# Patient Record
Sex: Male | Born: 1935 | Race: White | Hispanic: No
Health system: Southern US, Community
[De-identification: ages and names within clinical notes are randomized; demographics above are authoritative.]

## PROBLEM LIST (undated history)

## (undated) DIAGNOSIS — F0390 Unspecified dementia without behavioral disturbance: Secondary | ICD-10-CM

---

## 2019-10-16 ENCOUNTER — Other Ambulatory Visit: Payer: Self-pay

## 2019-10-16 ENCOUNTER — Inpatient Hospital Stay
Admission: EM | Admit: 2019-10-16 | Discharge: 2019-10-26 | DRG: 640 | Disposition: A | Discharge status: Hospice | Payer: Medicare Other | Attending: Family Medicine | Admitting: Family Medicine

## 2019-10-16 ENCOUNTER — Encounter: Payer: Self-pay | Admitting: Internal Medicine

## 2019-10-16 ENCOUNTER — Emergency Department: Payer: Medicare Other

## 2019-10-16 DIAGNOSIS — F0281 Dementia in other diseases classified elsewhere with behavioral disturbance: Secondary | ICD-10-CM | POA: Diagnosis not present

## 2019-10-16 DIAGNOSIS — R4182 Altered mental status, unspecified: Secondary | ICD-10-CM

## 2019-10-16 DIAGNOSIS — E871 Hypo-osmolality and hyponatremia: Secondary | ICD-10-CM | POA: Diagnosis present

## 2019-10-16 DIAGNOSIS — E870 Hyperosmolality and hypernatremia: Principal | ICD-10-CM | POA: Diagnosis present

## 2019-10-16 DIAGNOSIS — I10 Essential (primary) hypertension: Secondary | ICD-10-CM | POA: Diagnosis present

## 2019-10-16 DIAGNOSIS — E876 Hypokalemia: Secondary | ICD-10-CM | POA: Diagnosis present

## 2019-10-16 DIAGNOSIS — E872 Acidosis, unspecified: Secondary | ICD-10-CM | POA: Diagnosis present

## 2019-10-16 DIAGNOSIS — R627 Adult failure to thrive: Secondary | ICD-10-CM | POA: Diagnosis present

## 2019-10-16 DIAGNOSIS — Z7982 Long term (current) use of aspirin: Secondary | ICD-10-CM | POA: Diagnosis not present

## 2019-10-16 DIAGNOSIS — J189 Pneumonia, unspecified organism: Secondary | ICD-10-CM

## 2019-10-16 DIAGNOSIS — R1310 Dysphagia, unspecified: Secondary | ICD-10-CM | POA: Diagnosis present

## 2019-10-16 DIAGNOSIS — R778 Other specified abnormalities of plasma proteins: Secondary | ICD-10-CM | POA: Diagnosis present

## 2019-10-16 DIAGNOSIS — B948 Sequelae of other specified infectious and parasitic diseases: Secondary | ICD-10-CM | POA: Diagnosis not present

## 2019-10-16 DIAGNOSIS — Z515 Encounter for palliative care: Secondary | ICD-10-CM | POA: Diagnosis present

## 2019-10-16 DIAGNOSIS — Z66 Do not resuscitate: Secondary | ICD-10-CM

## 2019-10-16 DIAGNOSIS — N179 Acute kidney failure, unspecified: Secondary | ICD-10-CM | POA: Diagnosis present

## 2019-10-16 DIAGNOSIS — E860 Dehydration: Secondary | ICD-10-CM | POA: Diagnosis present

## 2019-10-16 DIAGNOSIS — F0391 Unspecified dementia with behavioral disturbance: Secondary | ICD-10-CM | POA: Diagnosis present

## 2019-10-16 DIAGNOSIS — F03918 Unspecified dementia, unspecified severity, with other behavioral disturbance: Secondary | ICD-10-CM | POA: Diagnosis present

## 2019-10-16 DIAGNOSIS — G309 Alzheimer's disease, unspecified: Secondary | ICD-10-CM | POA: Diagnosis not present

## 2019-10-16 DIAGNOSIS — G9341 Metabolic encephalopathy: Secondary | ICD-10-CM | POA: Diagnosis present

## 2019-10-16 HISTORY — DX: Unspecified dementia, unspecified severity, without behavioral disturbance, psychotic disturbance, mood disturbance, and anxiety: F03.90

## 2019-10-16 LAB — COMPREHENSIVE METABOLIC PANEL
ALT: 36 U/L (ref 0–44)
AST: 57 U/L — ABNORMAL HIGH (ref 15–41)
Albumin: 4.5 g/dL (ref 3.5–5.0)
Alkaline Phosphatase: 70 U/L (ref 38–126)
Anion gap: 27 — ABNORMAL HIGH (ref 5–15)
BUN: 122 mg/dL — ABNORMAL HIGH (ref 8–23)
CO2: 16 mmol/L — ABNORMAL LOW (ref 22–32)
Calcium: 9.7 mg/dL (ref 8.9–10.3)
Chloride: 121 mmol/L — ABNORMAL HIGH (ref 98–111)
Creatinine, Ser: 4.41 mg/dL — ABNORMAL HIGH (ref 0.61–1.24)
GFR calc Af Amer: 13 mL/min — ABNORMAL LOW (ref >60)
GFR calc non Af Amer: 11 mL/min — ABNORMAL LOW (ref >60)
Glucose, Bld: 162 mg/dL — ABNORMAL HIGH (ref 70–99)
Potassium: 4 mmol/L (ref 3.5–5.1)
Sodium: 164 mmol/L (ref 135–145)
Total Bilirubin: 1.5 mg/dL — ABNORMAL HIGH (ref 0.3–1.2)
Total Protein: 9.2 g/dL — ABNORMAL HIGH (ref 6.5–8.1)

## 2019-10-16 LAB — BASIC METABOLIC PANEL
Anion gap: 18 — ABNORMAL HIGH (ref 5–15)
BUN: 122 mg/dL — ABNORMAL HIGH (ref 8–23)
CO2: 19 mmol/L — ABNORMAL LOW (ref 22–32)
Calcium: 8.6 mg/dL — ABNORMAL LOW (ref 8.9–10.3)
Chloride: 121 mmol/L — ABNORMAL HIGH (ref 98–111)
Creatinine, Ser: 3.79 mg/dL — ABNORMAL HIGH (ref 0.61–1.24)
GFR calc Af Amer: 16 mL/min — ABNORMAL LOW (ref >60)
GFR calc non Af Amer: 13 mL/min — ABNORMAL LOW (ref >60)
Glucose, Bld: 242 mg/dL — ABNORMAL HIGH (ref 70–99)
Potassium: 3.8 mmol/L (ref 3.5–5.1)
Sodium: 158 mmol/L — ABNORMAL HIGH (ref 135–145)

## 2019-10-16 LAB — CBC WITH DIFFERENTIAL/PLATELET
Abs Immature Granulocytes: 0.08 10*3/uL — ABNORMAL HIGH (ref 0.00–0.07)
Basophils Absolute: 0.1 10*3/uL (ref 0.0–0.1)
Basophils Relative: 0 %
Eosinophils Absolute: 0 10*3/uL (ref 0.0–0.5)
Eosinophils Relative: 0 %
HCT: 60.2 % — ABNORMAL HIGH (ref 39.0–52.0)
Hemoglobin: 19.3 g/dL — ABNORMAL HIGH (ref 13.0–17.0)
Immature Granulocytes: 1 %
Lymphocytes Relative: 19 %
Lymphs Abs: 3.2 10*3/uL (ref 0.7–4.0)
MCH: 29.6 pg (ref 26.0–34.0)
MCHC: 32.1 g/dL (ref 30.0–36.0)
MCV: 92.3 fL (ref 80.0–100.0)
Monocytes Absolute: 0.9 10*3/uL (ref 0.1–1.0)
Monocytes Relative: 5 %
Neutro Abs: 12.6 10*3/uL — ABNORMAL HIGH (ref 1.7–7.7)
Neutrophils Relative %: 75 %
Platelets: 306 10*3/uL (ref 150–400)
RBC: 6.52 MIL/uL — ABNORMAL HIGH (ref 4.22–5.81)
RDW: 13.8 % (ref 11.5–15.5)
WBC: 16.9 10*3/uL — ABNORMAL HIGH (ref 4.0–10.5)
nRBC: 0.2 % (ref 0.0–0.2)

## 2019-10-16 LAB — URINALYSIS, COMPLETE (UACMP) WITH MICROSCOPIC
Bilirubin Urine: NEGATIVE
Glucose, UA: NEGATIVE mg/dL
Hgb urine dipstick: NEGATIVE
Ketones, ur: NEGATIVE mg/dL
Leukocytes,Ua: NEGATIVE
Nitrite: NEGATIVE
Protein, ur: 30 mg/dL — AB
Specific Gravity, Urine: 1.021 (ref 1.005–1.030)
pH: 5 (ref 5.0–8.0)

## 2019-10-16 LAB — LACTIC ACID, PLASMA
Lactic Acid, Venous: 7.3 mmol/L (ref 0.5–1.9)
Lactic Acid, Venous: 4.3 mmol/L (ref 0.5–1.9)
Lactic Acid, Venous: 4.3 mmol/L (ref 0.5–1.9)

## 2019-10-16 LAB — CREATININE, URINE, RANDOM: Creatinine, Urine: 392 mg/dL

## 2019-10-16 LAB — TROPONIN I (HIGH SENSITIVITY)
Troponin I (High Sensitivity): 174 ng/L (ref <18)
Troponin I (High Sensitivity): 153 ng/L (ref <18)
Troponin I (High Sensitivity): 134 ng/L (ref <18)

## 2019-10-16 LAB — SODIUM, URINE, RANDOM: Sodium, Ur: <10 mmol/L

## 2019-10-16 LAB — MRSA PCR SCREENING: MRSA by PCR: NEGATIVE

## 2019-10-16 MED ORDER — DEXTROSE 5 % IV SOLN: Freq: Once | INTRAVENOUS | Status: AC

## 2019-10-16 MED ORDER — ONDANSETRON HCL 4 MG PO TABS: 4.0000 mg | ORAL_TABLET | Freq: Four times a day (QID) | ORAL | Status: DC | PRN

## 2019-10-16 MED ORDER — ACETAMINOPHEN 650 MG RE SUPP: 650.0000 mg | Freq: Four times a day (QID) | RECTAL | Status: DC | PRN

## 2019-10-16 MED ORDER — SENNOSIDES-DOCUSATE SODIUM 8.6-50 MG PO TABS: 1.0000 | ORAL_TABLET | Freq: Every evening | ORAL | Status: DC | PRN

## 2019-10-16 MED ORDER — ACETAMINOPHEN 325 MG PO TABS: 650.0000 mg | ORAL_TABLET | Freq: Four times a day (QID) | ORAL | Status: DC | PRN

## 2019-10-16 MED ORDER — SODIUM CHLORIDE 0.9 % IV SOLN
1.0000 g | INTRAVENOUS | Status: DC
  Administered 2019-10-16: 1 g via INTRAVENOUS
  Filled 2019-10-16 (×2): qty 10

## 2019-10-16 MED ORDER — DEXTROSE 5 % IV SOLN: Freq: Once | INTRAVENOUS | Status: DC

## 2019-10-16 MED ORDER — SODIUM CHLORIDE 0.9 % IV SOLN
500.0000 mg | INTRAVENOUS | Status: DC
  Administered 2019-10-16: 18:00:00 500 mg via INTRAVENOUS
  Filled 2019-10-16 (×2): qty 500

## 2019-10-16 MED ORDER — HEPARIN SODIUM (PORCINE) 5000 UNIT/ML IJ SOLN
5000.0000 [IU] | Freq: Three times a day (TID) | INTRAMUSCULAR | Status: DC
  Administered 2019-10-16: 5000 [IU] via SUBCUTANEOUS
  Filled 2019-10-16: qty 1

## 2019-10-16 MED ORDER — LACTATED RINGERS IV BOLUS
1000.0000 mL | Freq: Once | INTRAVENOUS | Status: AC
  Administered 2019-10-16: 1000 mL via INTRAVENOUS

## 2019-10-16 MED ORDER — DEXTROSE 5 % IV SOLN: INTRAVENOUS | Status: DC

## 2019-10-16 MED ORDER — HEPARIN SODIUM (PORCINE) 5000 UNIT/ML IJ SOLN
5000.0000 [IU] | Freq: Three times a day (TID) | INTRAMUSCULAR | Status: DC
  Administered 2019-10-17 – 2019-10-21 (×14): 5000 [IU] via SUBCUTANEOUS
  Filled 2019-10-16 (×15): qty 1

## 2019-10-16 MED ORDER — ONDANSETRON HCL 4 MG/2ML IJ SOLN: 4.0000 mg | Freq: Four times a day (QID) | INTRAMUSCULAR | Status: DC | PRN

## 2019-10-16 NOTE — ED Notes (Addendum)
Second iv placed, unable to obtain blood cultures, pt has limited access and difficult to get blood r/t hydration status. Ok for just one set blood cx per dr Jimmye Norman

## 2019-10-16 NOTE — ED Notes (Signed)
Patient transported to CT 

## 2019-10-16 NOTE — H&P (Signed)
History and Physical:    Marc Perez   VWU:[id number] DOB: 08/21/1930 DOA: 10/16/2019  Referring MD/provider: Dr. Daryel NovemberJonathan Perez PCP: Patient, No Pcp Per   Patient coming from:   Chief Complaint: Increasing confusion/altered mental status  History of Present Illness:   Marc Perez is an 85 y.o. male with medical history significant for hypertension and dementia who was brought from the nursing home because of increasing confusion and altered mental status.  Patient is confused and unable to provide any history.  I spoke to his healthcare power of attorney, Mr. Marc Perez, who said patient has dementia at baseline but is able to communicate to some extent.  He also said that the patient has no family and he is the sole Education officer, environmentaldecision-maker.  Patient is a DO NOT RESUSCITATE.  ED Course:  The patient was found to have severe hypernatremia and acute renal failure.  He also had leukocytosis and lactic acidosis.  Chest x-ray showed right lower lobe consolidation.  He was given IV fluids in the emergency room.  ROS:   ROS unable to obtain because patient has dementia and is confused Past Medical History:   Past Medical History:  Diagnosis Date  . Dementia [facility](HCC)     Past Surgical History:   No known surgeries  Social History:   Social History   Socioeconomic History  . Marital status: Widowed    Spouse name: Not on file  . Number of children: Not on file  . Years of education: Not on file  . Highest education level: Not on file  Occupational History  . Not on file  Tobacco Use  . Smoking status: Not on file  Substance and Sexual Activity  . Alcohol use: Not on file  . Drug use: Not on file  . Sexual activity: Not on file  Other Topics Concern  . Not on file  Social History Narrative  . Not on file   Social Determinants of Health   Financial Resource Strain:   . Difficulty of Paying Living Expenses: Not on file  Food Insecurity:   . Worried About Patent examinerunning Out  of Food in the Last Year: Not on file  . Ran Out of Food in the Last Year: Not on file  Transportation Needs:   . Lack of Transportation (Medical): Not on file  . Lack of Transportation (Non-Medical): Not on file  Physical Activity:   . Days of Exercise per Week: Not on file  . Minutes of Exercise per Session: Not on file  Stress:   . Feeling of Stress : Not on file  Social Connections:   . Frequency of Communication with Friends and Family: Not on file  . Frequency of Social Gatherings with Friends and Family: Not on file  . Attends Religious Services: Not on file  . Active Member of Clubs or Organizations: Not on file  . Attends BankerClub or Organization Meetings: Not on file  . Marital Status: Not on file  Intimate Partner Violence:   . Fear of Current or Ex-Partner: Not on file  . Emotionally Abused: Not on file  . Physically Abused: Not on file  . Sexually Abused: Not on file    Allergies   Patient has no known allergies.  Family history:   No family history on file.  Current Medications:   Prior to Admission medications   Not on File    Physical Exam:   Vitals:   10/16/19 1239 10/16/19 1251 10/16/19 1317 10/16/19 1430  BP:  134/90 (!) 152/72  Pulse:      Resp:   (!) 27 20  Temp:      TempSrc:      SpO2:  100% 100% 100%  Weight: 74.8 kg     Height: 5\' 10"  (1.778 m)        Physical Exam: Blood pressure (!) 152/72, pulse 100, temperature 97.7 F (36.5 C), temperature source Rectal, resp. rate 20, height 5\' 10"  (1.778 m), weight 74.8 kg, SpO2 100 %. Gen: No acute distress. Head: Normocephalic, atraumatic. Eyes: Pupils equal, round and reactive to light. Extraocular movements intact.  Sclerae nonicteric.  Mouth: Dry mucous membranes Neck: Supple, no thyromegaly, no lymphadenopathy, no jugular venous distention. Chest: Lungs are clear to auscultation with good air movement. No rales, rhonchi or wheezes.  CV: Heart sounds are regular with an S1, S2. No  murmurs, rubs, clicks, or gallops.  Abdomen: Soft, nontender, nondistended with normal active bowel sounds. No hepatosplenomegaly or palpable masses. Extremities: Extremities are without clubbing, or cyanosis. No edema. Pedal pulses 2+.  Skin: Warm and dry.  Bruises on knees on elbows Neuro: Alert but disoriented, confused Psych: Poor insight and judgment   Data Review:    Labs: Basic Metabolic Panel: Recent Labs  Lab 10/16/19 1224  NA 164*  K 4.0  CL 121*  CO2 16*  GLUCOSE 162*  BUN 122*  CREATININE 4.41*  CALCIUM 9.7   Liver Function Tests: Recent Labs  Lab 10/16/19 1224  AST 57*  ALT 36  ALKPHOS 70  BILITOT 1.5*  PROT 9.2*  ALBUMIN 4.5   No results for input(s): LIPASE, AMYLASE in the last 168 hours. No results for input(s): AMMONIA in the last 168 hours. CBC: Recent Labs  Lab 10/16/19 1224  WBC 16.9*  NEUTROABS 12.6*  HGB 19.3*  HCT 60.2*  MCV 92.3  PLT 306   Cardiac Enzymes: No results for input(s): CKTOTAL, CKMB, CKMBINDEX, TROPONINI in the last 168 hours.  BNP (last 3 results) No results for input(s): PROBNP in the last 8760 hours. CBG: No results for input(s): GLUCAP in the last 168 hours.  Urinalysis    Component Value Date/Time   COLORURINE AMBER (A) 10/16/2019 1228   APPEARANCEUR CLOUDY (A) 10/16/2019 1228   LABSPEC 1.021 10/16/2019 1228   PHURINE 5.0 10/16/2019 1228   GLUCOSEU NEGATIVE 10/16/2019 1228   HGBUR NEGATIVE 10/16/2019 1228   BILIRUBINUR NEGATIVE 10/16/2019 [place] 10/16/2019 1228   PROTEINUR 30 (A) 10/16/2019 1228   NITRITE NEGATIVE 10/16/2019 1228   LEUKOCYTESUR NEGATIVE 10/16/2019 1228      Radiographic Studies: DG Chest 1 View  Result Date: 10/16/2019 CLINICAL DATA:  Weakness EXAM: CHEST  1 VIEW COMPARISON:  None. FINDINGS: The heart size and mediastinal contours are within normal limits. Minimal patchy density at the right lung base. The visualized skeletal structures are unremarkable.  IMPRESSION: Minimal patchy atelectasis/consolidation at the right lung base. Electronically Signed   By: Macy Mis M.D.   On: 10/16/2019 13:22   CT Head Wo Contrast  Result Date: 10/16/2019 CLINICAL DATA:  Progressive altered mental status. History of dementia. COVID-19. EXAM: CT HEAD WITHOUT CONTRAST TECHNIQUE: Contiguous axial images were obtained from the base of the skull through the vertex without intravenous contrast. COMPARISON:  None. FINDINGS: Brain: No evidence of acute infarction, hemorrhage, hydrocephalus, extra-axial collection or mass lesion/mass effect. Diffuse moderate atrophy with secondary ventricular dilatation. Chronic periventricular white matter lucency particularly in the frontal lobes, likely small vessel ischemic disease. Vascular: No  hyperdense vessel or unexpected calcification. Skull: Normal. Negative for fracture or focal lesion. Sinuses/Orbits: Normal. Other: None IMPRESSION: No acute abnormality. Atrophy with chronic small vessel ischemic disease. Electronically Signed   By: Francene Boyers M.D.   On: 10/16/2019 13:00    EKG: Independently reviewed.  Normal sinus rhythm, right bundle branch block and left anterior fascicular block   Assessment/Plan:   Principal Problem:   Hypernatremia Active Problems:   Acute renal failure (ARF) (HCC)   Lactic acidosis   Body mass index is 23.68 kg/m.   Severe hypernatremia: Admit to MedSurg and monitor on telemetry.  Treat with IV fluids.  Repeat BMP today to follow-up sodium level.  Severe acute renal failure: Treat with IV fluids and monitor BMP.  Chart review shows that creatinine was 1.2 on November 02, 2017 at [facility].  Mildly elevated troponin: This is likely due to acute renal failure.  Repeat troponin.  Right lung base consolidation: Unknown whether this is pneumonia or not but will cover with empiric IV antibiotics given lactic acidosis and leukocytosis.  Leukocytosis/bandemia: Probably due to severe  dehydration/acute renal failure.  Severe lactic acidosis: This is likely due to acute renal failure.  Repeat lactic acid level.  Other information:   DVT prophylaxis: Heparin Code Status: DNR. Family Communication: Plan discussed with his healthcare power of attorney, Mr. Marc Sorrow Perez Disposition Plan: Possible discharge to nursing home in 3 to 4 days Consults called: None Admission status: Inpatient  The medical decision making on this patient was of high complexity and the patient is at high risk for clinical deterioration, therefore this is a level 3 visit.   Time spent 65 minutes  Promise Weldin [facility]   How to contact the [facility] Attending or Consulting provider 7A - 7P or covering provider during after hours 7P -7A, for this patient?   1. Check the care team in [facility] and look for a) attending/consulting [facility] provider listed and b) the [facility] team listed 2. Log into [email or web address] and use [facility]'s universal password to access. If you do not have the password, please contact the hospital operator. 3. Locate the [facility] provider you are looking for under [facility] and page to a number that you can be directly reached. 4. If you still have difficulty reaching the provider, please page the [facility] (Director on Call) for the Hospitalists listed on amion for assistance.  10/16/2019, 2:51 PM

## 2019-10-16 NOTE — ED Provider Notes (Signed)
[facility] Emergency Department Provider Note       Time seen: ----------------------------------------- 12:31 PM on 10/16/2019 ----------------------------------------- Level V caveat: History/ROS limited by altered mental status  I have reviewed the triage vital signs and the nursing notes.  HISTORY   Chief Complaint Altered Mental Status   HPI Marc Perez is a 85 y.o. male with a history of dementia who presents to the ED for altered mental status that has been increasing since Friday.  Patient does have a history of dementia, no other information is known at this time.  No past medical history on file.  There are no problems to display for this patient.  Allergies Patient has no allergy information on record.  Social History Social History   Tobacco Use  . Smoking status: Not on file  Substance Use Topics  . Alcohol use: Not on file  . Drug use: Not on file    Review of Systems Unknown, positive for altered mental status All systems negative/normal/unremarkable except as stated in the HPI  ____________________________________________   PHYSICAL EXAM:  VITAL SIGNS: ED Triage Vitals  Enc Vitals Group     BP      Pulse      Resp      Temp      Temp src      SpO2      Weight      Height      Head Circumference      Peak Flow      Pain Score      Pain Loc      Pain Edu?      Excl. in GC?     Constitutional: Alert but disoriented, chronically ill-appearing, no acute distress ENT      Head: Normocephalic and atraumatic.      Nose: No congestion/rhinnorhea.      Mouth/Throat: Mucous membranes are moist.      Neck: No stridor. Cardiovascular: Normal rate, regular rhythm. No murmurs, rubs, or gallops. Respiratory: Normal respiratory effort without tachypnea nor retractions. Breath sounds are clear and equal bilaterally. No wheezes/rales/rhonchi. Gastrointestinal: Soft and nontender. Normal bowel sounds Musculoskeletal:  Limited range of motion of the extremities Neurologic:  No gross focal neurologic deficits are appreciated.  Generalized weakness, nothing focal Skin:  Skin is warm, dry and intact. No rash noted. Psychiatric: Flat affect ____________________________________________  EKG: Interpreted by me.  Sinus rhythm with rate of 98 bpm, right bundle branch block, left anterior fascicular block, long QT  ____________________________________________  ED COURSE:  As part of my medical decision making, I reviewed the following data within the electronic MEDICAL RECORD NUMBER History obtained from family if available, nursing notes, old chart and ekg, as well as notes from prior ED visits. Patient presented for weakness and altered mental status, we will assess with labs and imaging as indicated at this time.   Procedures  Marc Perez was evaluated in Emergency Department on 10/16/2019 for the symptoms described in the history of present illness. He was evaluated in the context of the global COVID-19 pandemic, which necessitated consideration that the patient might be at risk for infection with the SARS-CoV-2 virus that causes COVID-19. Institutional protocols and algorithms that pertain to the evaluation of patients at risk for COVID-19 are in a state of rapid change based on information released by regulatory bodies including the CDC and federal and state organizations. These policies and algorithms were followed during the patient's care in the ED.  ____________________________________________  LABS (pertinent positives/negatives)  Labs Reviewed  CBC WITH DIFFERENTIAL/PLATELET - Abnormal; Notable for the following components:      Result Value   WBC 16.9 (*)    RBC 6.52 (*)    Hemoglobin 19.3 (*)    HCT 60.2 (*)    Neutro Abs 12.6 (*)    Abs Immature Granulocytes 0.08 (*)    All other components within normal limits  COMPREHENSIVE METABOLIC PANEL - Abnormal; Notable for the following components:    Sodium 164 (*)    Chloride 121 (*)    CO2 16 (*)    Glucose, Bld 162 (*)    BUN 122 (*)    Creatinine, Ser 4.41 (*)    Total Protein 9.2 (*)    AST 57 (*)    Total Bilirubin 1.5 (*)    GFR calc non Af Amer 11 (*)    GFR calc Af Amer 13 (*)    Anion gap 27 (*)    All other components within normal limits  URINALYSIS, COMPLETE (UACMP) WITH MICROSCOPIC - Abnormal; Notable for the following components:   Color, Urine AMBER (*)    APPearance CLOUDY (*)    Protein, ur 30 (*)    Bacteria, UA RARE (*)    All other components within normal limits  CULTURE, BLOOD (ROUTINE X 2)  CULTURE, BLOOD (ROUTINE X 2)  LACTIC ACID, PLASMA  TROPONIN I (HIGH SENSITIVITY)   CRITICAL CARE Performed by: Laurence Aly   Total critical care time: 30 minutes  Critical care time was exclusive of separately billable procedures and treating other patients.  Critical care was necessary to treat or prevent imminent or life-threatening deterioration.  Critical care was time spent personally by me on the following activities: development of treatment plan with patient and/or surrogate as well as nursing, discussions with consultants, evaluation of patient's response to treatment, examination of patient, obtaining history from patient or surrogate, ordering and performing treatments and interventions, ordering and review of laboratory studies, ordering and review of radiographic studies, pulse oximetry and re-evaluation of patient's condition.  RADIOLOGY Images were viewed by me  CT head, chest x-ray IMPRESSION:  No acute abnormality. Atrophy with chronic small vessel ischemic  disease.  IMPRESSION:  Minimal patchy atelectasis/consolidation at the right lung base.  ____________________________________________   DIFFERENTIAL DIAGNOSIS   Dehydration, electrolyte abnormality, occult infection, sepsis, COVID-19, CVA, MI  FINAL ASSESSMENT AND PLAN  Altered mental status, severe dehydration, possible  pneumonia   Plan: The patient had presented for altered mental status. Patient's labs revealed severe dehydration with acute renal failure and a sodium of 164.  Creatinine was 4.41.  Initially on arrival I suspected this, gave him a lactated Ringer's bolus followed by ordering D5W at 100 cc/h.  He has a 5.1 L free water deficit on arrival. Patient's imaging reveals a possible atelectasis or consolidation in the right lung base.  I have ordered IV Levaquin for him.  I will discuss with the hospitalist for admission.   Laurence Aly, MD    Note: This note was generated in part or whole with voice recognition software. Voice recognition is usually quite accurate but there are transcription errors that can and very often do occur. I apologize for any typographical errors that were not detected and corrected.     Earleen Newport, MD 10/16/19 1329

## 2019-10-16 NOTE — ED Notes (Signed)
Transported to floor with SWAT RN

## 2019-10-16 NOTE — ED Triage Notes (Signed)
Pt arrives from [facility] via [facility]. Staff stated pt has had increasing altered mental status since last Friday. Pt has hx of dementia.

## 2019-10-17 ENCOUNTER — Inpatient Hospital Stay: Payer: Medicare Other

## 2019-10-17 ENCOUNTER — Encounter: Payer: Self-pay | Admitting: Internal Medicine

## 2019-10-17 LAB — BASIC METABOLIC PANEL
Anion gap: 15 (ref 5–15)
Anion gap: 15 (ref 5–15)
Anion gap: 12 (ref 5–15)
BUN: 113 mg/dL — ABNORMAL HIGH (ref 8–23)
BUN: 109 mg/dL — ABNORMAL HIGH (ref 8–23)
BUN: 91 mg/dL — ABNORMAL HIGH (ref 8–23)
CO2: 20 mmol/L — ABNORMAL LOW (ref 22–32)
CO2: 20 mmol/L — ABNORMAL LOW (ref 22–32)
CO2: 22 mmol/L (ref 22–32)
Calcium: 8.6 mg/dL — ABNORMAL LOW (ref 8.9–10.3)
Calcium: 8.5 mg/dL — ABNORMAL LOW (ref 8.9–10.3)
Calcium: 8.4 mg/dL — ABNORMAL LOW (ref 8.9–10.3)
Chloride: 122 mmol/L — ABNORMAL HIGH (ref 98–111)
Chloride: 123 mmol/L — ABNORMAL HIGH (ref 98–111)
Chloride: 120 mmol/L — ABNORMAL HIGH (ref 98–111)
Creatinine, Ser: 3.21 mg/dL — ABNORMAL HIGH (ref 0.61–1.24)
Creatinine, Ser: 2.57 mg/dL — ABNORMAL HIGH (ref 0.61–1.24)
Creatinine, Ser: 2.02 mg/dL — ABNORMAL HIGH (ref 0.61–1.24)
GFR calc Af Amer: 19 mL/min — ABNORMAL LOW (ref >60)
GFR calc Af Amer: 25 mL/min — ABNORMAL LOW (ref >60)
GFR calc Af Amer: 33 mL/min — ABNORMAL LOW (ref >60)
GFR calc non Af Amer: 16 mL/min — ABNORMAL LOW (ref >60)
GFR calc non Af Amer: 22 mL/min — ABNORMAL LOW (ref >60)
GFR calc non Af Amer: 29 mL/min — ABNORMAL LOW (ref >60)
Glucose, Bld: 122 mg/dL — ABNORMAL HIGH (ref 70–99)
Glucose, Bld: 156 mg/dL — ABNORMAL HIGH (ref 70–99)
Glucose, Bld: 140 mg/dL — ABNORMAL HIGH (ref 70–99)
Potassium: 3.6 mmol/L (ref 3.5–5.1)
Potassium: 3.5 mmol/L (ref 3.5–5.1)
Potassium: 3.4 mmol/L — ABNORMAL LOW (ref 3.5–5.1)
Sodium: 157 mmol/L — ABNORMAL HIGH (ref 135–145)
Sodium: 158 mmol/L — ABNORMAL HIGH (ref 135–145)
Sodium: 154 mmol/L — ABNORMAL HIGH (ref 135–145)

## 2019-10-17 LAB — CBC
HCT: 49.2 % (ref 39.0–52.0)
Hemoglobin: 15.9 g/dL (ref 13.0–17.0)
MCH: 29.2 pg (ref 26.0–34.0)
MCHC: 32.3 g/dL (ref 30.0–36.0)
MCV: 90.4 fL (ref 80.0–100.0)
Platelets: 194 10*3/uL (ref 150–400)
RBC: 5.44 MIL/uL (ref 4.22–5.81)
RDW: 13.5 % (ref 11.5–15.5)
WBC: 15.3 10*3/uL — ABNORMAL HIGH (ref 4.0–10.5)
nRBC: 0 % (ref 0.0–0.2)

## 2019-10-17 LAB — LACTIC ACID, PLASMA
Lactic Acid, Venous: 3.5 mmol/L (ref 0.5–1.9)
Lactic Acid, Venous: 2.4 mmol/L (ref 0.5–1.9)

## 2019-10-17 LAB — MAGNESIUM: Magnesium: 3.5 mg/dL — ABNORMAL HIGH (ref 1.7–2.4)

## 2019-10-17 LAB — SARS CORONAVIRUS 2 (TAT 6-24 HRS): SARS Coronavirus 2: POSITIVE — AB

## 2019-10-17 MED ORDER — POTASSIUM CHLORIDE 10 MEQ/100ML IV SOLN
10.0000 meq | INTRAVENOUS | Status: AC
  Administered 2019-10-17 (×3): 10 meq via INTRAVENOUS
  Filled 2019-10-17 (×3): qty 100

## 2019-10-17 MED ORDER — ORAL CARE MOUTH RINSE
15.0000 mL | Freq: Two times a day (BID) | OROMUCOSAL | Status: DC
  Administered 2019-10-17 – 2019-10-26 (×18): 15 mL via OROMUCOSAL

## 2019-10-17 MED ORDER — LORAZEPAM 2 MG/ML IJ SOLN
1.0000 mg | Freq: Once | INTRAMUSCULAR | Status: AC
  Administered 2019-10-17: 1 mg via INTRAVENOUS
  Filled 2019-10-17: qty 1

## 2019-10-17 MED ORDER — SODIUM CHLORIDE 0.9% FLUSH
3.0000 mL | Freq: Two times a day (BID) | INTRAVENOUS | Status: DC
  Administered 2019-10-17 – 2019-10-24 (×13): 3 mL via INTRAVENOUS

## 2019-10-17 NOTE — Progress Notes (Signed)
In to see pt. Pt only oriented to self. Pt with feet up in the air rocking back and forth trying to get out of bed. Pt hollering and trying to pull mitts off. Notified NP. Orders placed. Will continue to monitor and assess.

## 2019-10-17 NOTE — Progress Notes (Addendum)
Progress Note    Marc Perez  GEZ:[id number] DOB: 02-01-1930  DOA: 10/16/2019 PCP: Patient, No Pcp Per        Assessment/Plan:   Principal Problem:   Hypernatremia Active Problems:   Acute renal failure (ARF) (HCC)   Lactic acidosis   Body mass index is 23.68 kg/m.   Severe hypernatremia: Slowly improving.  Continue IV fluids.  Repeat BMP today and adjust IV fluids accordingly.  Severe acute renal failure: Slowly improving.  Urine sodium is less than 10 and FENa is less than 1% suggestive of prerenal etiology.  Continue IV fluids and monitor BMP.  His creatinine was 1.2 on November 02, 2017 at [facility].  Mildly elevated troponin: This is likely due to acute renal failure.    Repeat chest x-ray today did not show any right lung base consolidation that was seen on chest x-ray done on 10/16/2019.  Discontinue IV antibiotics.  Leukocytosis/bandemia: Likely to severe dehydration/acute renal failure.  Severe lactic acidosis: This is likely due to acute renal failure.  Improved.   Dysphagia: Speech therapist recommended dysphagia 1 diet and nectar thick liquids  History of coronavirus infection a month ago: This was confirmed by [name], HPOA. Apparently (according to ED nurse Dellis Filbert), nursing home could not fax record of his previous positive test so patient had a repeat coronavirus test on 10/16/2019 which came back positive.   Family Communication/Anticipated D/C date and plan/Code Status   DVT prophylaxis: Heparin subcu Code Status: DNR Family Communication: Plan of care was discussed with [name], [place]. Marland Kitchen Disposition Plan: Possible discharge to SNF in 2 to 3 days     Subjective:   He is confused and unable to provide any history.  Objective:    Vitals:   10/16/19 2032 10/17/19 0453 10/17/19 0643 10/17/19 0810  BP: 138/74 126/61 (!) 150/87 (!) 148/73  Pulse: 74 68 73 74  Resp: 20 16 20 20   Temp: (!) 97.4 F (36.3 C)  97.9 F (36.6 C) 97.6 F (36.4 C)   TempSrc: Oral Oral Oral   SpO2: 98% 99% 100% 99%  Weight:      Height:        Intake/Output Summary (Last 24 hours) at 10/17/2019 1420 Last data filed at 10/17/2019 1300 Gross per 24 hour  Intake 1019.21 ml  Output --  Net 1019.21 ml   Filed Weights   10/16/19 1239  Weight: 74.8 kg    Exam:  GEN: NAD SKIN: No rash EYES: EOMI ENT: MMM CV: RRR PULM: CTA B ABD: soft, ND, NT, +BS CNS: Alert but confused, non focal EXT: No edema or tenderness   Data Reviewed:   I have personally reviewed following labs and imaging studies:  Labs: Labs show the following:   Basic Metabolic Panel: Recent Labs  Lab 10/16/19 1224 10/16/19 1741 10/16/19 2328 10/17/19 0544  NA 164* 158* 157* 158*  K 4.0 3.8 3.6 3.5  CL 121* 121* 122* 123*  CO2 16* 19* 20* 20*  GLUCOSE 162* 242* 122* 156*  BUN 122* 122* 113* 109*  CREATININE 4.41* 3.79* 3.21* 2.57*  CALCIUM 9.7 8.6* 8.6* 8.5*  MG  --   --  3.5*  --    GFR Estimated Creatinine Clearance: 20.9 mL/min (A) (by C-G formula based on SCr of 2.57 mg/dL (H)). Liver Function Tests: Recent Labs  Lab 10/16/19 1224  AST 57*  ALT 36  ALKPHOS 70  BILITOT 1.5*  PROT 9.2*  ALBUMIN 4.5  No results for input(s): LIPASE, AMYLASE in the last 168 hours. No results for input(s): AMMONIA in the last 168 hours. Coagulation profile No results for input(s): INR, PROTIME in the last 168 hours.  CBC: Recent Labs  Lab 10/16/19 1224 10/17/19 0544  WBC 16.9* 15.3*  NEUTROABS 12.6*  --   HGB 19.3* 15.9  HCT 60.2* 49.2  MCV 92.3 90.4  PLT 306 194   Cardiac Enzymes: No results for input(s): CKTOTAL, CKMB, CKMBINDEX, TROPONINI in the last 168 hours. BNP (last 3 results) No results for input(s): PROBNP in the last 8760 hours. CBG: No results for input(s): GLUCAP in the last 168 hours. D-Dimer: No results for input(s): DDIMER in the last 72 hours. Hgb A1c: No results for input(s): HGBA1C in the last  72 hours. Lipid Profile: No results for input(s): CHOL, HDL, LDLCALC, TRIG, CHOLHDL, LDLDIRECT in the last 72 hours. Thyroid function studies: No results for input(s): TSH, T4TOTAL, T3FREE, THYROIDAB in the last 72 hours.  Invalid input(s): FREET3 Anemia work up: No results for input(s): VITAMINB12, FOLATE, FERRITIN, TIBC, IRON, RETICCTPCT in the last 72 hours. Sepsis Labs: Recent Labs  Lab 10/16/19 1224 10/16/19 1611 10/16/19 1741 10/16/19 2328 10/17/19 0544 10/17/19 0714  WBC 16.9*  --   --   --  15.3*  --   LATICACIDVEN 7.3* 4.3* 4.3* 3.5*  --  2.4*    Microbiology Recent Results (from the past 240 hour(s))  Blood culture (routine x 2)     Status: None (Preliminary result)   Collection Time: 10/16/19 12:25 PM   Specimen: BLOOD  Result Value Ref Range Status   Specimen Description BLOOD RIGHT ANTECUBITAL  Final   Special Requests   Final    BOTTLES DRAWN AEROBIC AND ANAEROBIC Blood Culture adequate volume   Culture   Final    NO GROWTH < 24 HOURS Performed at [facility], [address] [place], [place] [address]    Report Status PENDING  Incomplete  SARS CORONAVIRUS 2 (TAT 6-24 HRS) Nasopharyngeal Nasopharyngeal Swab     Status: Abnormal   Collection Time: 10/16/19  4:43 PM   Specimen: Nasopharyngeal Swab  Result Value Ref Range Status   SARS Coronavirus 2 POSITIVE (A) NEGATIVE Final    Comment: RESULT CALLED TO, READ BACK BY AND VERIFIED WITH: J.PAGE,RN [id number] 10/17/19 G.MCADOO (NOTE) SARS-CoV-2 target nucleic acids are DETECTED. The SARS-CoV-2 RNA is generally detectable in upper and lower respiratory specimens during the acute phase of infection. Positive results are indicative of the presence of SARS-CoV-2 RNA. Clinical correlation with patient history and other diagnostic information is  necessary to determine patient infection status. Positive results do not rule out bacterial infection or co-infection with other viruses.  The expected result is  Negative. Fact Sheet for Patients: HairSlick.no Fact Sheet for Healthcare Providers: quierodirigir.com This test is not yet approved or cleared by the [place] FDA and  has been authorized for detection and/or diagnosis of SARS-CoV-2 by FDA under an Emergency Use Authorization (EUA). This EUA will remain  in effect (meaning this test can be used) for the du ration of the COVID-19 declaration under Section 564(b)(1) of the Act, 21 U.S.C. section 360bbb-3(b)(1), unless the authorization is terminated or revoked sooner. Performed at [facility], [address] [place], [place] [address]   Blood culture (routine x 2)     Status: None (Preliminary result)   Collection Time: 10/16/19  5:41 PM   Specimen: BLOOD  Result Value Ref Range Status  Specimen Description BLOOD BLOOD RIGHT HAND  Final   Special Requests   Final    BOTTLES DRAWN AEROBIC AND ANAEROBIC Blood Culture results may not be optimal due to an inadequate volume of blood received in culture bottles   Culture   Final    NO GROWTH < 24 HOURS Performed at [facility], [address] [place], [place] [address]    Report Status PENDING  Incomplete  MRSA PCR Screening     Status: None   Collection Time: 10/16/19  8:36 PM   Specimen: Nasopharyngeal  Result Value Ref Range Status   MRSA by PCR NEGATIVE NEGATIVE Final    Comment:        The GeneXpert MRSA Assay (FDA approved for NASAL specimens only), is one component of a comprehensive MRSA colonization surveillance program. It is not intended to diagnose MRSA infection nor to guide or monitor treatment for MRSA infections. Performed at [facility], [address] [place], [place] [address]     Procedures and diagnostic studies:  DG Chest 1 View  Result Date: 10/16/2019 CLINICAL DATA:  Weakness EXAM: CHEST  1 VIEW COMPARISON:  None. FINDINGS: The heart size and mediastinal  contours are within normal limits. Minimal patchy density at the right lung base. The visualized skeletal structures are unremarkable. IMPRESSION: Minimal patchy atelectasis/consolidation at the right lung base. Electronically Signed   By: Guadlupe SpanishPraneil  Patel M.D.   On: 10/16/2019 13:22   CT Head Wo Contrast  Result Date: 10/16/2019 CLINICAL DATA:  Progressive altered mental status. History of dementia. COVID-19. EXAM: CT HEAD WITHOUT CONTRAST TECHNIQUE: Contiguous axial images were obtained from the base of the skull through the vertex without intravenous contrast. COMPARISON:  None. FINDINGS: Brain: No evidence of acute infarction, hemorrhage, hydrocephalus, extra-axial collection or mass lesion/mass effect. Diffuse moderate atrophy with secondary ventricular dilatation. Chronic periventricular white matter lucency particularly in the frontal lobes, likely small vessel ischemic disease. Vascular: No hyperdense vessel or unexpected calcification. Skull: Normal. Negative for fracture or focal lesion. Sinuses/Orbits: Normal. Other: None IMPRESSION: No acute abnormality. Atrophy with chronic small vessel ischemic disease. Electronically Signed   By: Francene BoyersJames  Maxwell M.D.   On: 10/16/2019 13:00   DG Chest Port 1 View  Result Date: 10/17/2019 CLINICAL DATA:  COVID positive.  Pneumonia. EXAM: PORTABLE CHEST 1 VIEW COMPARISON:  10/16/2019 FINDINGS: Heart and mediastinal contours are within normal limits. No focal opacities or effusions. No acute bony abnormality. IMPRESSION: No active disease. Electronically Signed   By: Charlett NoseKevin  Dover M.D.   On: 10/17/2019 10:09    Medications:   . heparin  5,000 Units Subcutaneous Q8H  . mouth rinse  15 mL Mouth Rinse BID   Continuous Infusions: . dextrose 100 mL/hr at 10/17/19 0643     LOS: 1 day   Santosha Jividen  [facility]   *Please refer to [email or web address], password [email or web address] to get updated schedule on who will round on this patient, as hospitalists switch teams  weekly. If 7PM-7AM, please contact night-coverage at [email or web address], password [email or web address] for any overnight needs.  10/17/2019, 2:20 PM

## 2019-10-17 NOTE — Plan of Care (Signed)
  Problem: Urinary Elimination: Goal: Signs and symptoms of infection will decrease Outcome: Progressing   Problem: Safety: Goal: Ability to remain free from injury will improve Outcome: Progressing   

## 2019-10-17 NOTE — Progress Notes (Signed)
Patient being transferred to 2A, POA Sonia Side Ridenhour) notified.

## 2019-10-17 NOTE — Evaluation (Signed)
Clinical/Bedside Swallow Evaluation Patient Details  Name: Marc Perez MRN: [id number] Date of Birth: 06/17/1930  Today's Date: 10/17/2019 Time: SLP Start Time (ACUTE ONLY): 9629 SLP Stop Time (ACUTE ONLY): 0940 SLP Time Calculation (min) (ACUTE ONLY): 65 min  Past Medical History:  Past Medical History:  Diagnosis Date  . Dementia [facility])    Past Surgical History: History reviewed. No pertinent surgical history. HPI:  Pt is an 85 y.o. male with medical history significant for hypertension and Dementia who was brought from the nursing home because of increasing confusion and altered mental status.  Patient is confused and unable to provide any history.  I spoke to his healthcare power of attorney, Marc Perez, who said patient has dementia at baseline but is able to communicate to some extent.  He also said that the patient has no family and he is the sole Marine scientist.  Pt resides at [facility] and requires assistance w/ ADLs.  The patient was found to have severe hypernatremia and acute renal failure.  He also had leukocytosis and lactic acidosis.  Chest x-ray showed yesterday "Minimal patchy atelectasis/consolidation at the right lung base";  CXR today revealed "No active disease".  Pt is Covid Positive per Lab test and is on Airborne precautions.    Assessment / Plan / Recommendation Clinical Impression  Pt appears to present w/ oropharyngeal phase dysphagia; pt is at increased risk for aspiration at this time in light of declined Cognitive status(Dementia baseline). Pt is also missing Dentition and had a TERRIBLE oral cavity presentation w/ Mod-Severe dried/wet secretions and phlegm covering the roof of the mouth, cheeks, and tongue. Pt required min-mod verbal/tactile cues to follow along w/ SLP in order that oral care be done. Pt's oral cavity was cleaned; debris removed. Pt was accepting of po trials presented by SLP but suspect he reluctance may have been impacted by the TERRIBLE  status of his oral cavity initially; Cogitive decline/awareness. Pt consumed few po trials accepted w/ subtle s/s of aspiration noted: delayed throat clearing w/ trials of ice chips and thin liquids. No s/s of aspiration were noted w/ trials of Nectar liquids and purees -- no decline in respiratory status, no delayed throat clearing/coughing. Pt did not verbalize w/ SLP; few phonations noted. Bolus management of po trials revealed min decreased awareness of boluses for timely, cohesive management. W/ trials of puree and Nectar liquids, pt appeared to exhibit the best lingual control of bolus material; pharyngeal swallows were appreciated and oral clearing achieved. Pt appeared to [facility] when he swallowed and nodded when asked if throat was "sore". OM exam revealed no gross unilateral weakness. Pt required feeding support, positioning upright for po's.  Recommend a Dysphagial evel 1 w/ Nectar liquids w/ aspiration precautions; feeding support at meals and monitoring for s/s of aspiration. Pills Crushed in Puree. ST services will continue to monitor status while admitted. MD/NSG updated.  SLP Visit Diagnosis: Dysphagia, oropharyngeal phase (R13.12)(declined Cognitive status, Dementia)    Aspiration Risk  Mild-Mod aspiration risk;Risk for inadequate nutrition/hydration    Diet Recommendation  Dysphagia level 1 (puree w/ gravies) w/ Nectar consistency liquids; aspiration precautions; feeding support and oral care by staff   Medication Administration: Crushed with puree(for safer swallowing)    Other  Recommendations Recommended Consults: (Dietician f/u; Palliative Care consult for [place]) Oral Care Recommendations: Oral care BID;Oral care before and after PO;Staff/trained caregiver to provide oral care Other Recommendations: Order thickener from pharmacy;Prohibited food (jello, ice cream, thin soups);Remove water pitcher;Have oral suction available  Follow up Recommendations Skilled Nursing facility       Frequency and Duration min 3x week  2 weeks       Prognosis Prognosis for Safe Diet Advancement: Fair Barriers to Reach Goals: Cognitive deficits;Time post onset;Severity of deficits      Swallow Study   General Date of Onset: 10/16/19 HPI: Pt is an 85 y.o. male with medical history significant for hypertension and Dementia who was brought from the nursing home because of increasing confusion and altered mental status.  Patient is confused and unable to provide any history.  I spoke to his healthcare power of attorney, Marc Perez, who said patient has dementia at baseline but is able to communicate to some extent.  He also said that the patient has no family and he is the sole Education officer, environmental.  Pt resides at [facility] and requires assistance w/ ADLs.  The patient was found to have severe hypernatremia and acute renal failure.  He also had leukocytosis and lactic acidosis.  Chest x-ray showed yesterday "Minimal patchy atelectasis/consolidation at the right lung base";  CXR today revealed "No active disease".  Pt is Covid Positive per Lab test and is on Airborne precautions.  Type of Study: Bedside Swallow Evaluation Previous Swallow Assessment: none per chart Diet Prior to this Study: NPO(unknown baseline) Temperature Spikes Noted: No(wbc 15.3) Respiratory Status: Room air History of Recent Intubation: No Behavior/Cognition: Alert;Cooperative;Pleasant mood;Confused;Distractible;Requires cueing Oral Cavity Assessment: Dried secretions;Excessive secretions(chunks of debris) Oral Care Completed by SLP: Yes Oral Cavity - Dentition: Poor condition;Missing dentition Vision: (n/a) Self-Feeding Abilities: Total assist Patient Positioning: Upright in bed(needed positioning) Baseline Vocal Quality: Low vocal intensity(mumbled speech) Volitional Cough: Cognitively unable to elicit Volitional Swallow: Unable to elicit    Oral/Motor/Sensory Function Overall Oral Motor/Sensory Function:  Within functional limits(grossly - no unilateral weakness)   Ice Chips Ice chips: Impaired Presentation: Spoon(fed; 2 trials) Oral Phase Impairments: Poor awareness of bolus;Reduced lingual movement/coordination Oral Phase Functional Implications: Prolonged oral transit Pharyngeal Phase Impairments: (throat clearing x1) Other Comments: seemed to wince when swallowing   Thin Liquid Thin Liquid: Impaired Presentation: Spoon(fed; 3 trials) Oral Phase Impairments: Reduced labial seal;Reduced lingual movement/coordination;Poor awareness of bolus Oral Phase Functional Implications: Prolonged oral transit(spillage) Pharyngeal  Phase Impairments: Throat Clearing - Delayed    Nectar Thick Nectar Thick Liquid: Within functional limits(grossly) Presentation: Spoon;Straw(4 trials via spoon; 2 trials via straw) Other Comments: he would not accept more. Seemed to wince when swallowing.   Honey Thick Honey Thick Liquid: Not tested   Puree Puree: Within functional limits(grossly) Presentation: Spoon(fed; 4 trials) Other Comments: seemed to wince when swallowing. Would not accept more.    Solid     Solid: Not tested Other Comments: missing Dentition; Cognitive status       Jerilynn Som, MS, CCC-SLP Chriss Mannan 10/17/2019,11:54 AM

## 2019-10-17 NOTE — Plan of Care (Signed)
  Problem: Education: Goal: Knowledge of General Education information will improve Description Including pain rating scale, medication(s)/side effects and non-pharmacologic comfort measures Outcome: Progressing   Problem: Elimination: Goal: Will not experience complications related to urinary retention Outcome: Progressing   Problem: Safety: Goal: Ability to remain free from injury will improve Outcome: Progressing   

## 2019-10-18 DIAGNOSIS — F0391 Unspecified dementia with behavioral disturbance: Secondary | ICD-10-CM | POA: Diagnosis present

## 2019-10-18 DIAGNOSIS — G309 Alzheimer's disease, unspecified: Secondary | ICD-10-CM

## 2019-10-18 DIAGNOSIS — F0281 Dementia in other diseases classified elsewhere with behavioral disturbance: Secondary | ICD-10-CM

## 2019-10-18 DIAGNOSIS — F03918 Unspecified dementia, unspecified severity, with other behavioral disturbance: Secondary | ICD-10-CM | POA: Diagnosis present

## 2019-10-18 LAB — BASIC METABOLIC PANEL
Anion gap: 11 (ref 5–15)
BUN: 66 mg/dL — ABNORMAL HIGH (ref 8–23)
CO2: 24 mmol/L (ref 22–32)
Calcium: 8.5 mg/dL — ABNORMAL LOW (ref 8.9–10.3)
Chloride: 116 mmol/L — ABNORMAL HIGH (ref 98–111)
Creatinine, Ser: 1.41 mg/dL — ABNORMAL HIGH (ref 0.61–1.24)
GFR calc Af Amer: 52 mL/min — ABNORMAL LOW (ref >60)
GFR calc non Af Amer: 44 mL/min — ABNORMAL LOW (ref >60)
Glucose, Bld: 119 mg/dL — ABNORMAL HIGH (ref 70–99)
Potassium: 3.5 mmol/L (ref 3.5–5.1)
Sodium: 151 mmol/L — ABNORMAL HIGH (ref 135–145)

## 2019-10-18 LAB — CBC
HCT: 46.8 % (ref 39.0–52.0)
Hemoglobin: 15.3 g/dL (ref 13.0–17.0)
MCH: 29.2 pg (ref 26.0–34.0)
MCHC: 32.7 g/dL (ref 30.0–36.0)
MCV: 89.3 fL (ref 80.0–100.0)
Platelets: 165 10*3/uL (ref 150–400)
RBC: 5.24 MIL/uL (ref 4.22–5.81)
RDW: 13.2 % (ref 11.5–15.5)
WBC: 9.1 10*3/uL (ref 4.0–10.5)
nRBC: 0 % (ref 0.0–0.2)

## 2019-10-18 LAB — MAGNESIUM: Magnesium: 3.1 mg/dL — ABNORMAL HIGH (ref 1.7–2.4)

## 2019-10-18 LAB — GLUCOSE, CAPILLARY: Glucose-Capillary: 100 mg/dL — ABNORMAL HIGH (ref 70–99)

## 2019-10-18 MED ORDER — POTASSIUM CL IN DEXTROSE 5% 20 MEQ/L IV SOLN
20.0000 meq | INTRAVENOUS | Status: DC
  Administered 2019-10-18 – 2019-10-20 (×4): 20 meq via INTRAVENOUS
  Filled 2019-10-18 (×6): qty 1000

## 2019-10-18 NOTE — Progress Notes (Addendum)
Progress Note    Marc Perez  DJM:[id number] DOB: [birth year]  DOA: 10/16/2019 PCP: Patient, No Pcp Per        Assessment/Plan:   Principal Problem:   Hypernatremia Active Problems:   Acute renal failure (ARF) (HCC)   Lactic acidosis   Dementia with behavioral disturbance (HCC)   Body mass index is 23.68 kg/m.   Severe hypernatremia: Slowly improving.  Continue IV fluids.  Repeat BMP tomorrow  Severe acute renal failure: Slowly improving.  Urine sodium is less than 10 and FENa is less than 1% suggestive of prerenal etiology.  Continue IV fluids and monitor BMP.  His creatinine was 1.2 on November 02, 2017 at [facility] healthcare.  Mildly elevated troponin: This is likely due to acute renal failure.    Hypokalemia: Improved.  Continue repletion.  Hypermagnesemia: This is likely due to renal failure.  It is expected to improve as renal failure improves.  Repeat chest x-ray on 10/17/2019 did not show any right lung base consolidation that was seen on chest x-ray done on 10/16/2019.   Leukocytosis/bandemia: Resolved  Severe lactic acidosis: This is likely due to acute renal failure.  Improved.   Dysphagia: Speech therapist recommended dysphagia 1 diet and nectar thick liquids  History of coronavirus infection a month ago: This was confirmed by [name], HPOA. Apparently (according to ED nurse Tinnie Gens), nursing home could not fax record of his previous positive test so patient had a repeat coronavirus test on 10/16/2019 which came back positive.   Family Communication/Anticipated D/C date and plan/Code Status   DVT prophylaxis: Heparin subcu Code Status: DNR Family Communication: None. Disposition Plan: Possible discharge to SNF in 2 days     Subjective:   He is confused and unable to provide any history.  Objective:    Vitals:   10/17/19 2045 10/18/19 0441 10/18/19 0447 10/18/19 0759  BP: 123/71  132/86 126/78  Pulse: 80  (!) 56 67  Resp: 20   20 18   Temp: 98.4 F (36.9 C)  97.7 F (36.5 C) 98.4 F (36.9 C)  TempSrc: Oral  Oral Oral  SpO2: 98%  100% 99%  Weight:  74.8 kg    Height:        Intake/Output Summary (Last 24 hours) at 10/18/2019 1402 Last data filed at 10/18/2019 0439 Gross per 24 hour  Intake 142.62 ml  Output 500 ml  Net -357.38 ml   Filed Weights   10/16/19 1239 10/18/19 0441  Weight: 74.8 kg 74.8 kg    Exam:  GEN: NAD SKIN: No rash EYES: No pallor or icterus ENT: MMM CV: RRR PULM: CTA B ABD: soft, ND, NT, +BS CNS: AAO x 1 (person), non focal EXT: No edema or tenderness    Data Reviewed:   I have personally reviewed following labs and imaging studies:  Labs: Labs show the following:   Basic Metabolic Panel: Recent Labs  Lab 10/16/19 1741 10/16/19 2328 10/17/19 0544 10/17/19 1555 10/18/19 0609  NA 158* 157* 158* 154* 151*  K 3.8 3.6 3.5 3.4* 3.5  CL 121* 122* 123* 120* 116*  CO2 19* 20* 20* 22 24  GLUCOSE 242* 122* 156* 140* 119*  BUN 122* 113* 109* 91* 66*  CREATININE 3.79* 3.21* 2.57* 2.02* 1.41*  CALCIUM 8.6* 8.6* 8.5* 8.4* 8.5*  MG  --  3.5*  --   --  3.1*   GFR Estimated Creatinine Clearance: 38.1 mL/min (A) (by C-G formula based on SCr of 1.41 mg/dL (H)).  Liver Function Tests: Recent Labs  Lab 10/16/19 1224  AST 57*  ALT 36  ALKPHOS 70  BILITOT 1.5*  PROT 9.2*  ALBUMIN 4.5   No results for input(s): LIPASE, AMYLASE in the last 168 hours. No results for input(s): AMMONIA in the last 168 hours. Coagulation profile No results for input(s): INR, PROTIME in the last 168 hours.  CBC: Recent Labs  Lab 10/16/19 1224 10/17/19 0544 10/18/19 0609  WBC 16.9* 15.3* 9.1  NEUTROABS 12.6*  --   --   HGB 19.3* 15.9 15.3  HCT 60.2* 49.2 46.8  MCV 92.3 90.4 89.3  PLT 306 194 165   Cardiac Enzymes: No results for input(s): CKTOTAL, CKMB, CKMBINDEX, TROPONINI in the last 168 hours. BNP (last 3 results) No results for input(s): PROBNP in the last 8760  hours. CBG: No results for input(s): GLUCAP in the last 168 hours. D-Dimer: No results for input(s): DDIMER in the last 72 hours. Hgb A1c: No results for input(s): HGBA1C in the last 72 hours. Lipid Profile: No results for input(s): CHOL, HDL, LDLCALC, TRIG, CHOLHDL, LDLDIRECT in the last 72 hours. Thyroid function studies: No results for input(s): TSH, T4TOTAL, T3FREE, THYROIDAB in the last 72 hours.  Invalid input(s): FREET3 Anemia work up: No results for input(s): VITAMINB12, FOLATE, FERRITIN, TIBC, IRON, RETICCTPCT in the last 72 hours. Sepsis Labs: Recent Labs  Lab 10/16/19 1224 10/16/19 1611 10/16/19 1741 10/16/19 2328 10/17/19 0544 10/17/19 0714 10/18/19 0609  WBC 16.9*  --   --   --  15.3*  --  9.1  LATICACIDVEN 7.3* 4.3* 4.3* 3.5*  --  2.4*  --     Microbiology Recent Results (from the past 240 hour(s))  Blood culture (routine x 2)     Status: None (Preliminary result)   Collection Time: 10/16/19 12:25 PM   Specimen: BLOOD  Result Value Ref Range Status   Specimen Description BLOOD RIGHT ANTECUBITAL  Final   Special Requests   Final    BOTTLES DRAWN AEROBIC AND ANAEROBIC Blood Culture adequate volume   Culture   Final    NO GROWTH 2 DAYS Performed at [facility], [address] [place], [place] [address]    Report Status PENDING  Incomplete  SARS CORONAVIRUS 2 (TAT 6-24 HRS) Nasopharyngeal Nasopharyngeal Swab     Status: Abnormal   Collection Time: 10/16/19  4:43 PM   Specimen: Nasopharyngeal Swab  Result Value Ref Range Status   SARS Coronavirus 2 POSITIVE (A) NEGATIVE Final    Comment: RESULT CALLED TO, READ BACK BY AND VERIFIED WITH: J.PAGE,RN [id number] 10/17/19 G.MCADOO (NOTE) SARS-CoV-2 target nucleic acids are DETECTED. The SARS-CoV-2 RNA is generally detectable in upper and lower respiratory specimens during the acute phase of infection. Positive results are indicative of the presence of SARS-CoV-2 RNA. Clinical correlation with patient  history and other diagnostic information is  necessary to determine patient infection status. Positive results do not rule out bacterial infection or co-infection with other viruses.  The expected result is Negative. Fact Sheet for Patients: HairSlick.nohttps://www.fda.gov/media/138098/download Fact Sheet for Healthcare Providers: quierodirigir.comhttps://www.fda.gov/media/138095/download This test is not yet approved or cleared by the [place] FDA and  has been authorized for detection and/or diagnosis of SARS-CoV-2 by FDA under an Emergency Use Authorization (EUA). This EUA will remain  in effect (meaning this test can be used) for the du ration of the COVID-19 declaration under Section 564(b)(1) of the Act, 21 U.S.C. section 360bbb-3(b)(1), unless the authorization is terminated or revoked sooner. Performed at  [facility], [place] [address], [place], [place] [address]   Blood culture (routine x 2)     Status: None (Preliminary result)   Collection Time: 10/16/19  5:41 PM   Specimen: BLOOD  Result Value Ref Range Status   Specimen Description BLOOD BLOOD RIGHT HAND  Final   Special Requests   Final    BOTTLES DRAWN AEROBIC AND ANAEROBIC Blood Culture results may not be optimal due to an inadequate volume of blood received in culture bottles   Culture   Final    NO GROWTH 2 DAYS Performed at [facility], [address] [place], [place] [address]    Report Status PENDING  Incomplete  MRSA PCR Screening     Status: None   Collection Time: 10/16/19  8:36 PM   Specimen: Nasopharyngeal  Result Value Ref Range Status   MRSA by PCR NEGATIVE NEGATIVE Final    Comment:        The GeneXpert MRSA Assay (FDA approved for NASAL specimens only), is one component of a comprehensive MRSA colonization surveillance program. It is not intended to diagnose MRSA infection nor to guide or monitor treatment for MRSA infections. Performed at [facility], [address] [place],   [address]     Procedures and diagnostic studies:  DG Chest [facility] 1 View  Result Date: 10/17/2019 CLINICAL DATA:  COVID positive.  Pneumonia. EXAM: PORTABLE CHEST 1 VIEW COMPARISON:  10/16/2019 FINDINGS: Heart and mediastinal contours are within normal limits. No focal opacities or effusions. No acute bony abnormality. IMPRESSION: No active disease. Electronically Signed   By: Rolm Baptise M.D.   On: 10/17/2019 10:09    Medications:   . heparin  5,000 Units Subcutaneous Q8H  . mouth rinse  15 mL Mouth Rinse BID  . sodium chloride flush  3 mL Intravenous Q12H   Continuous Infusions: . dextrose 5 % with KCl 20 mEq / L 20 mEq (10/18/19 1200)     LOS: 2 days   Kasie Leccese  [facility]   *Please refer to [email or web address], password [email or web address] to get updated schedule on who will round on this patient, as hospitalists switch teams weekly. If 7PM-7AM, please contact night-coverage at [email or web address], password [email or web address] for any overnight needs.  10/18/2019, 2:02 PM

## 2019-10-19 LAB — CBC
HCT: 46 % (ref 39.0–52.0)
Hemoglobin: 15.3 g/dL (ref 13.0–17.0)
MCH: 29.5 pg (ref 26.0–34.0)
MCHC: 33.3 g/dL (ref 30.0–36.0)
MCV: 88.8 fL (ref 80.0–100.0)
Platelets: 169 10*3/uL (ref 150–400)
RBC: 5.18 MIL/uL (ref 4.22–5.81)
RDW: 12.9 % (ref 11.5–15.5)
WBC: 10.2 10*3/uL (ref 4.0–10.5)
nRBC: 0 % (ref 0.0–0.2)

## 2019-10-19 LAB — BASIC METABOLIC PANEL
Anion gap: 8 (ref 5–15)
BUN: 42 mg/dL — ABNORMAL HIGH (ref 8–23)
CO2: 26 mmol/L (ref 22–32)
Calcium: 8.5 mg/dL — ABNORMAL LOW (ref 8.9–10.3)
Chloride: 116 mmol/L — ABNORMAL HIGH (ref 98–111)
Creatinine, Ser: 1.41 mg/dL — ABNORMAL HIGH (ref 0.61–1.24)
GFR calc Af Amer: 52 mL/min — ABNORMAL LOW (ref >60)
GFR calc non Af Amer: 44 mL/min — ABNORMAL LOW (ref >60)
Glucose, Bld: 117 mg/dL — ABNORMAL HIGH (ref 70–99)
Potassium: 3.9 mmol/L (ref 3.5–5.1)
Sodium: 150 mmol/L — ABNORMAL HIGH (ref 135–145)

## 2019-10-19 NOTE — Progress Notes (Signed)
SLP Cancellation Note  Patient Details Name: Marc Perez MRN: [id number] DOB: 24-Jul-1930   Cancelled treatment:       Reason Eval/Treat Not Completed: Patient declined, no reason specified(chart reviewed; consulted NSG staff re: pt). Pt has been declining po's with NSG staff other than 2-3 bites/sips at a meal. He has been having increased confusion requiring Mitts, hollering and attempting to get out of bed per NSG. Suspect his Dementia is impacting overall presentation. Pt is at risk for being unable to meet nutrition/hydration needs sufficiently and safely, especially at discharge. A palliative care consult is recommended for GOC.  Consult MD, NSG re: above. ST services will continue to follow pt's status while admitted. Recommend continue current dysphagia diet w/ aspiration precautions; oral care.      Orinda Kenner, MS, CCC-SLP Marc Perez 10/19/2019, 1:42 PM

## 2019-10-19 NOTE — Plan of Care (Signed)
  Problem: Elimination: Goal: Will not experience complications related to urinary retention Outcome: Progressing   Problem: Respiratory: Goal: Will maintain a patent airway Outcome: Progressing Goal: Complications related to the disease process, condition or treatment will be avoided or minimized Outcome: Progressing

## 2019-10-19 NOTE — Progress Notes (Signed)
Progress Note    Marc Perez  FOY:[id number] DOB: April 24, 1930  DOA: 10/16/2019 PCP: Patient, No Pcp Per        Assessment/Plan:   Principal Problem:   Hypernatremia Active Problems:   Acute renal failure (ARF) (HCC)   Lactic acidosis   Dementia with behavioral disturbance (HCC)   Body mass index is 23.82 kg/m.   Severe hypernatremia: Slowly improving.  Continue IV fluids.  Repeat BMP tomorrow  Severe acute renal failure: Slowly improving.  Urine sodium is less than 10 and FENa is less than 1% suggestive of prerenal etiology.  Continue IV fluids and monitor BMP.  His creatinine was 1.2 on November 02, 2017 at [facility] healthcare.  Mildly elevated troponin: This is likely due to acute renal failure.    Hypokalemia: Improved.  Continue repletion.  Hypermagnesemia: This is likely due to renal failure.  It is expected to improve as renal failure improves.  Repeat chest x-ray on 10/17/2019 did not show any right lung base consolidation that was seen on chest x-ray done on 10/16/2019.   Leukocytosis/bandemia: Resolved  Severe lactic acidosis: This is likely due to acute renal failure.  Improved.   Dysphagia: Speech therapist recommended dysphagia 1 diet and nectar thick liquids  History of coronavirus infection about a month ago: This was confirmed by Marc Perez, HPOA.     Family Communication/Anticipated D/C date and plan/Code Status   DVT prophylaxis: Heparin subcu Code Status: DNR Family Communication: Plan discussed with Mr. Perez over the phone Disposition Plan: Possible discharge to SNF in 1 to 2 days     Subjective:   No acute overnight events.  He is confused unable to provide any history.  Objective:    Vitals:   10/18/19 1730 10/18/19 2025 10/19/19 0357 10/19/19 0847  BP: 136/74 119/70 137/67 (!) 147/81  Pulse: 72 79 73 76  Resp: 17 17 18 18   Temp: 98 F (36.7 C) 99.3 F (37.4 C) 97.6 F (36.4 C) 98.2 F (36.8 C)  TempSrc: Oral  Oral Oral Oral  SpO2: 98% 96% 98% 97%  Weight:   75.3 kg   Height:        Intake/Output Summary (Last 24 hours) at 10/19/2019 1208 Last data filed at 10/19/2019 0413 Gross per 24 hour  Intake 1035.28 ml  Output 1025 ml  Net 10.28 ml   Filed Weights   10/16/19 1239 10/18/19 0441 10/19/19 0357  Weight: 74.8 kg 74.8 kg 75.3 kg    Exam:   GEN: NAD SKIN: No rash EYES: EOMI ENT: MMM CV: RRR PULM: CTA B ABD: soft, ND, NT, +BS CNS: AAO x 1, confused, non focal EXT: No edema or tenderness   Data Reviewed:   I have personally reviewed following labs and imaging studies:  Labs: Labs show the following:   Basic Metabolic Panel: Recent Labs  Lab 10/16/19 2328 10/17/19 0544 10/17/19 1555 10/18/19 0609 10/19/19 0456  NA 157* 158* 154* 151* 150*  K 3.6 3.5 3.4* 3.5 3.9  CL 122* 123* 120* 116* 116*  CO2 20* 20* 22 24 26   GLUCOSE 122* 156* 140* 119* 117*  BUN 113* 109* 91* 66* 42*  CREATININE 3.21* 2.57* 2.02* 1.41* 1.41*  CALCIUM 8.6* 8.5* 8.4* 8.5* 8.5*  MG 3.5*  --   --  3.1*  --    GFR Estimated Creatinine Clearance: 38.1 mL/min (A) (by C-G formula based on SCr of 1.41 mg/dL (H)). Liver Function Tests: Recent Labs  Lab 10/16/19 1224  AST 57*  ALT 36  ALKPHOS 70  BILITOT 1.5*  PROT 9.2*  ALBUMIN 4.5   No results for input(s): LIPASE, AMYLASE in the last 168 hours. No results for input(s): AMMONIA in the last 168 hours. Coagulation profile No results for input(s): INR, PROTIME in the last 168 hours.  CBC: Recent Labs  Lab 10/16/19 1224 10/17/19 0544 10/18/19 0609 10/19/19 0456  WBC 16.9* 15.3* 9.1 10.2  NEUTROABS 12.6*  --   --   --   HGB 19.3* 15.9 15.3 15.3  HCT 60.2* 49.2 46.8 46.0  MCV 92.3 90.4 89.3 88.8  PLT 306 194 165 169   Cardiac Enzymes: No results for input(s): CKTOTAL, CKMB, CKMBINDEX, TROPONINI in the last 168 hours. BNP (last 3 results) No results for input(s): PROBNP in the last 8760 hours. CBG: Recent Labs  Lab  10/18/19 2021  GLUCAP 100*   D-Dimer: No results for input(s): DDIMER in the last 72 hours. Hgb A1c: No results for input(s): HGBA1C in the last 72 hours. Lipid Profile: No results for input(s): CHOL, HDL, LDLCALC, TRIG, CHOLHDL, LDLDIRECT in the last 72 hours. Thyroid function studies: No results for input(s): TSH, T4TOTAL, T3FREE, THYROIDAB in the last 72 hours.  Invalid input(s): FREET3 Anemia work up: No results for input(s): VITAMINB12, FOLATE, FERRITIN, TIBC, IRON, RETICCTPCT in the last 72 hours. Sepsis Labs: Recent Labs  Lab 10/16/19 1224 10/16/19 1611 10/16/19 1741 10/16/19 2328 10/17/19 0544 10/17/19 0714 10/18/19 0609 10/19/19 0456  WBC 16.9*  --   --   --  15.3*  --  9.1 10.2  LATICACIDVEN 7.3* 4.3* 4.3* 3.5*  --  2.4*  --   --     Microbiology Recent Results (from the past 240 hour(s))  Blood culture (routine x 2)     Status: None (Preliminary result)   Collection Time: 10/16/19 12:25 PM   Specimen: BLOOD  Result Value Ref Range Status   Specimen Description BLOOD RIGHT ANTECUBITAL  Final   Special Requests   Final    BOTTLES DRAWN AEROBIC AND ANAEROBIC Blood Culture adequate volume   Culture   Final    NO GROWTH 3 DAYS Performed at [facility], [address] [place], [place] [address]    Report Status PENDING  Incomplete  SARS CORONAVIRUS 2 (TAT 6-24 HRS) Nasopharyngeal Nasopharyngeal Swab     Status: Abnormal   Collection Time: 10/16/19  4:43 PM   Specimen: Nasopharyngeal Swab  Result Value Ref Range Status   SARS Coronavirus 2 POSITIVE (A) NEGATIVE Final    Comment: RESULT CALLED TO, READ BACK BY AND VERIFIED WITH: J.PAGE,RN [id number] 10/17/19 Marc Perez (NOTE) SARS-CoV-2 target nucleic acids are DETECTED. The SARS-CoV-2 RNA is generally detectable in upper and lower respiratory specimens during the acute phase of infection. Positive results are indicative of the presence of SARS-CoV-2 RNA. Clinical correlation with patient history and  other diagnostic information is  necessary to determine patient infection status. Positive results do not rule out bacterial infection or co-infection with other viruses.  The expected result is Negative. Fact Sheet for Patients: SugarRoll.be Fact Sheet for Healthcare Providers: https://www.woods-mathews.com/ This test is not yet approved or cleared by the [place] FDA and  has been authorized for detection and/or diagnosis of SARS-CoV-2 by FDA under an Emergency Use Authorization (EUA). This EUA will remain  in effect (meaning this test can be used) for the du ration of the COVID-19 declaration under Section 564(b)(1) of the Act, 21 U.S.C. section 360bbb-3(b)(1), unless the authorization  is terminated or revoked sooner. Performed at [facility], [address] [place], [place] [address]   Blood culture (routine x 2)     Status: None (Preliminary result)   Collection Time: 10/16/19  5:41 PM   Specimen: BLOOD  Result Value Ref Range Status   Specimen Description BLOOD BLOOD RIGHT HAND  Final   Special Requests   Final    BOTTLES DRAWN AEROBIC AND ANAEROBIC Blood Culture results may not be optimal due to an inadequate volume of blood received in culture bottles   Culture   Final    NO GROWTH 3 DAYS Performed at [facility], [address] [place], [place] [address]    Report Status PENDING  Incomplete  MRSA PCR Screening     Status: None   Collection Time: 10/16/19  8:36 PM   Specimen: Nasopharyngeal  Result Value Ref Range Status   MRSA by PCR NEGATIVE NEGATIVE Final    Comment:        The GeneXpert MRSA Assay (FDA approved for NASAL specimens only), is one component of a comprehensive MRSA colonization surveillance program. It is not intended to diagnose MRSA infection nor to guide or monitor treatment for MRSA infections. Performed at [facility], [address] [place], [place] [address]      Procedures and diagnostic studies:  No results found.  Medications:   . heparin  5,000 Units Subcutaneous Q8H  . mouth rinse  15 mL Mouth Rinse BID  . sodium chloride flush  3 mL Intravenous Q12H   Continuous Infusions: . dextrose 5 % with KCl 20 mEq / L 75 mL/hr at 10/19/19 0348     LOS: 3 days   Christan Defranco  [facility]   *Please refer to [email or web address], password [email or web address] to get updated schedule on who will round on this patient, as hospitalists switch teams weekly. If 7PM-7AM, please contact night-coverage at [email or web address], password [email or web address] for any overnight needs.  10/19/2019, 12:08 PM

## 2019-10-20 LAB — BASIC METABOLIC PANEL
Anion gap: 10 (ref 5–15)
BUN: 29 mg/dL — ABNORMAL HIGH (ref 8–23)
CO2: 24 mmol/L (ref 22–32)
Calcium: 8.4 mg/dL — ABNORMAL LOW (ref 8.9–10.3)
Chloride: 111 mmol/L (ref 98–111)
Creatinine, Ser: 1.22 mg/dL (ref 0.61–1.24)
GFR calc Af Amer: >60 mL/min (ref >60)
GFR calc non Af Amer: 53 mL/min — ABNORMAL LOW (ref >60)
Glucose, Bld: 99 mg/dL (ref 70–99)
Potassium: 3.8 mmol/L (ref 3.5–5.1)
Sodium: 145 mmol/L (ref 135–145)

## 2019-10-20 LAB — MAGNESIUM: Magnesium: 2.6 mg/dL — ABNORMAL HIGH (ref 1.7–2.4)

## 2019-10-20 MED ORDER — POTASSIUM CL IN DEXTROSE 5% 20 MEQ/L IV SOLN
20.0000 meq | INTRAVENOUS | Status: DC
  Administered 2019-10-20 (×2): 20 meq via INTRAVENOUS
  Filled 2019-10-20 (×2): qty 1000

## 2019-10-20 NOTE — TOC Initial Note (Addendum)
Transition of Care [facility]) - Initial/Assessment Note    Patient Details  Name: Marc Perez MRN: [id number] Date of Birth: April 19, 1930  Transition of Care [facility]) CM/SW Contact:    Darleene Cleaver, LCSW Phone Number: 10/20/2019, 7:33 PM  Clinical Narrative:                  Patient is from [facility].  Patient is Covid positve, CSW to follow up with patient's family for follow up.  CSW requested that PT see patient and give recommendations on what needs he will have.  Patient will either have to go to SNF for short term rehab, or return back to [facility].  Expected Discharge Plan: Assisted Living Barriers to Discharge: Continued Medical Work up   Patient Goals and CMS Choice Patient states their goals for this hospitalization and ongoing recovery are:: To potentially return back to [facility]   Choice offered to / list presented to : Patient  Expected Discharge Plan and Services Expected Discharge Plan: Assisted Living       Living arrangements for the past 2 months: Assisted Living Facility                                      Prior Living Arrangements/Services Living arrangements for the past 2 months: Assisted Living Facility Lives with:: Facility Resident Patient language and need for interpreter reviewed:: Yes        Need for Family Participation in Patient Care: Yes (Comment) Care giver support system in place?: Yes (comment)   Criminal Activity/Legal Involvement Pertinent to Current Situation/Hospitalization: No - Comment as needed  Activities of Daily Living Home Assistive Devices/Equipment: None ADL Screening (condition at time of admission) Patient's cognitive ability adequate to safely complete daily activities?: No Is the patient deaf or have difficulty hearing?: No Does the patient have difficulty seeing, even when wearing glasses/contacts?: No Does the patient have difficulty concentrating, remembering, or making decisions?:  Yes Patient able to express need for assistance with ADLs?: Yes Does the patient have difficulty dressing or bathing?: Yes Independently performs ADLs?: No Communication: Independent Dressing (OT): Needs assistance Is this a change from baseline?: Pre-admission baseline Grooming: Needs assistance Is this a change from baseline?: Pre-admission baseline Feeding: Needs assistance Is this a change from baseline?: Pre-admission baseline Bathing: Needs assistance Is this a change from baseline?: Pre-admission baseline Toileting: Needs assistance Is this a change from baseline?: Pre-admission baseline In/Out Bed: Needs assistance Is this a change from baseline?: Pre-admission baseline Walks in Home: Needs assistance Is this a change from baseline?: Pre-admission baseline Does the patient have difficulty walking or climbing stairs?: Yes Weakness of Legs: Both Weakness of Arms/Hands: None  Permission Sought/Granted Permission sought to share information with : Case Manager, Family Supports Permission granted to share information with : Yes, Verbal Permission Granted  Share Information with NAME: [email or web address] Other   [phone]  Permission granted to share info w AGENCY: SNF admissions        Emotional Assessment Appearance:: Appears stated age   Affect (typically observed): Accepting, Calm Orientation: : Oriented to Self Alcohol / Substance Use: Not Applicable Psych Involvement: No (comment)  Admission diagnosis:  Dehydration [E86.0] Hypernatremia [E87.0] Acute renal failure, unspecified acute renal failure type (HCC) [N17.9] Altered mental status, unspecified altered mental status type [R41.82] Patient Active Problem List   Diagnosis Date Noted  . Dementia with behavioral disturbance (HCC)  10/18/2019  . Hypernatremia 10/16/2019  . Acute renal failure (ARF) ([place]) 10/16/2019  . Lactic acidosis 10/16/2019   PCP:  Patient, No Pcp Per Pharmacy:  No Pharmacies  Listed    Social Determinants of Health (SDOH) Interventions    Readmission Risk Interventions No flowsheet data found.

## 2019-10-20 NOTE — Plan of Care (Signed)
  Problem: Safety: Goal: Ability to remain free from injury will improve Outcome: Progressing   Problem: Respiratory: Goal: Will maintain a patent airway Outcome: Progressing Goal: Complications related to the disease process, condition or treatment will be avoided or minimized Outcome: Progressing   

## 2019-10-20 NOTE — Plan of Care (Signed)
  Problem: Urinary Elimination: Goal: Signs and symptoms of infection will decrease Outcome: Progressing   Problem: Elimination: Goal: Will not experience complications related to urinary retention Outcome: Progressing   Problem: Safety: Goal: Ability to remain free from injury will improve Outcome: Progressing

## 2019-10-20 NOTE — Progress Notes (Signed)
Progress Note    Donnelle Olmeda  ZJI:[id number] DOB: December 21, 1929  DOA: 10/16/2019 PCP: Patient, No Pcp Per    Brief Hospital course  Isao Seltzer is an 85 y.o. male with medical history significant for hypertension and dementia who was brought from the nursing home because of increasing confusion and altered mental status.  He was found to have severe hypernatremia and severe acute renal failure.  He was cautiously hydrated with IV fluids and hypernatremia and renal failure have resolved.    Assessment/Plan:   Principal Problem:   Hypernatremia Active Problems:   Acute renal failure (ARF) (HCC)   Lactic acidosis   Dementia with behavioral disturbance (HCC)   Body mass index is 23.82 kg/m.   Severe hypernatremia: Improved. Continue IV fluids because patient is barely eating anything.  Repeat BMP tomorrow  Severe acute renal failure: Improved. His creatinine was 1.2 on November 02, 2017 at [facility].  Mildly elevated troponin: This is likely due to acute renal failure.    Hypokalemia: Improved.  Continue repletion.  Hypermagnesemia: Improved.  This is likely due to renal failure.  It is expected to improve as renal failure improves.  Repeat chest x-ray on 10/17/2019 did not show any right lung base consolidation that was seen on chest x-ray done on 10/16/2019.   Leukocytosis/bandemia: Resolved  Severe lactic acidosis: This is likely due to acute renal failure.  Improved.   Dementia with dysphagia: Speech therapist recommended dysphagia 1 diet and nectar thick liquids but patient is barely eating anything so continue IV fluids for hydration.  Consulted palliative care.  History of coronavirus infection about a month ago: This was confirmed by [name], HPOA.     Family Communication/Anticipated D/C date and plan/Code Status   DVT prophylaxis: Heparin subcu Code Status: DNR Family Communication: None Disposition Plan: According to the Child psychotherapist,  patient will have to be evaluated by PT and OT before he can go back to ALF otherwise he may need placement to SNF.  Possible discharge to ALF or SNF in 1 to 2 days     Subjective:   No acute overnight events.  He is confused unable to provide any history.  Objective:    Vitals:   10/19/19 1718 10/19/19 2017 10/20/19 0451 10/20/19 0759  BP: (!) 142/69 (!) 157/66  135/66  Pulse: (!) 48 65  61  Resp: 19 18  19   Temp:  (!) 97.5 F (36.4 C)  98.4 F (36.9 C)  TempSrc:  Oral    SpO2:  98%    Weight:   75.3 kg   Height:        Intake/Output Summary (Last 24 hours) at 10/20/2019 1059 Last data filed at 10/20/2019 0400 Gross per 24 hour  Intake 675 ml  Output 450 ml  Net 225 ml   Filed Weights   10/18/19 0441 10/19/19 0357 10/20/19 0451  Weight: 74.8 kg 75.3 kg 75.3 kg    Exam:   GEN: NAD  SKIN: No rash EYES:Anicteric ENT: MMM CV: RRR PULM: CTA B ABD: soft, ND, NT, +BS CNS: AAO x 1, confused, non focal EXT: No edema or tenderness    Data Reviewed:   I have personally reviewed following labs and imaging studies:  Labs: Labs show the following:   Basic Metabolic Panel: Recent Labs  Lab 10/16/19 2328 10/17/19 0544 10/17/19 1555 10/18/19 0609 10/19/19 0456 10/20/19 0432  NA 157* 158* 154* 151* 150* 145  K 3.6 3.5 3.4* 3.5  3.9 3.8  CL 122* 123* 120* 116* 116* 111  CO2 20* 20* 22 24 26 24   GLUCOSE 122* 156* 140* 119* 117* 99  BUN 113* 109* 91* 66* 42* 29*  CREATININE 3.21* 2.57* 2.02* 1.41* 1.41* 1.22  CALCIUM 8.6* 8.5* 8.4* 8.5* 8.5* 8.4*  MG 3.5*  --   --  3.1*  --  2.6*   GFR Estimated Creatinine Clearance: 44 mL/min (by C-G formula based on SCr of 1.22 mg/dL). Liver Function Tests: Recent Labs  Lab 10/16/19 1224  AST 57*  ALT 36  ALKPHOS 70  BILITOT 1.5*  PROT 9.2*  ALBUMIN 4.5   No results for input(s): LIPASE, AMYLASE in the last 168 hours. No results for input(s): AMMONIA in the last 168 hours. Coagulation profile No results for  input(s): INR, PROTIME in the last 168 hours.  CBC: Recent Labs  Lab 10/16/19 1224 10/17/19 0544 10/18/19 0609 10/19/19 0456  WBC 16.9* 15.3* 9.1 10.2  NEUTROABS 12.6*  --   --   --   HGB 19.3* 15.9 15.3 15.3  HCT 60.2* 49.2 46.8 46.0  MCV 92.3 90.4 89.3 88.8  PLT 306 194 165 169   Cardiac Enzymes: No results for input(s): CKTOTAL, CKMB, CKMBINDEX, TROPONINI in the last 168 hours. BNP (last 3 results) No results for input(s): PROBNP in the last 8760 hours. CBG: Recent Labs  Lab 10/18/19 2021  GLUCAP 100*   D-Dimer: No results for input(s): DDIMER in the last 72 hours. Hgb A1c: No results for input(s): HGBA1C in the last 72 hours. Lipid Profile: No results for input(s): CHOL, HDL, LDLCALC, TRIG, CHOLHDL, LDLDIRECT in the last 72 hours. Thyroid function studies: No results for input(s): TSH, T4TOTAL, T3FREE, THYROIDAB in the last 72 hours.  Invalid input(s): FREET3 Anemia work up: No results for input(s): VITAMINB12, FOLATE, FERRITIN, TIBC, IRON, RETICCTPCT in the last 72 hours. Sepsis Labs: Recent Labs  Lab 10/16/19 1224 10/16/19 1611 10/16/19 1741 10/16/19 2328 10/17/19 0544 10/17/19 0714 10/18/19 0609 10/19/19 0456  WBC 16.9*  --   --   --  15.3*  --  9.1 10.2  LATICACIDVEN 7.3* 4.3* 4.3* 3.5*  --  2.4*  --   --     Microbiology Recent Results (from the past 240 hour(s))  Blood culture (routine x 2)     Status: None (Preliminary result)   Collection Time: 10/16/19 12:25 PM   Specimen: BLOOD  Result Value Ref Range Status   Specimen Description BLOOD RIGHT ANTECUBITAL  Final   Special Requests   Final    BOTTLES DRAWN AEROBIC AND ANAEROBIC Blood Culture adequate volume   Culture   Final    NO GROWTH 4 DAYS Performed at [facility], [address] [place], [place] [address]    Report Status PENDING  Incomplete  SARS CORONAVIRUS 2 (TAT 6-24 HRS) Nasopharyngeal Nasopharyngeal Swab     Status: Abnormal   Collection Time: 10/16/19  4:43 PM    Specimen: Nasopharyngeal Swab  Result Value Ref Range Status   SARS Coronavirus 2 POSITIVE (A) NEGATIVE Final    Comment: RESULT CALLED TO, READ BACK BY AND VERIFIED WITH: J.PAGE,RN [id number] 10/17/19 G.MCADOO (NOTE) SARS-CoV-2 target nucleic acids are DETECTED. The SARS-CoV-2 RNA is generally detectable in upper and lower respiratory specimens during the acute phase of infection. Positive results are indicative of the presence of SARS-CoV-2 RNA. Clinical correlation with patient history and other diagnostic information is  necessary to determine patient infection status. Positive results do not rule  out bacterial infection or co-infection with other viruses.  The expected result is Negative. Fact Sheet for Patients: SugarRoll.be Fact Sheet for Healthcare Providers: https://www.woods-mathews.com/ This test is not yet approved or cleared by the [place] FDA and  has been authorized for detection and/or diagnosis of SARS-CoV-2 by FDA under an Emergency Use Authorization (EUA). This EUA will remain  in effect (meaning this test can be used) for the du ration of the COVID-19 declaration under Section 564(b)(1) of the Act, 21 U.S.C. section 360bbb-3(b)(1), unless the authorization is terminated or revoked sooner. Performed at [facility], [place] [address], [place], [place] [address]   Blood culture (routine x 2)     Status: None (Preliminary result)   Collection Time: 10/16/19  5:41 PM   Specimen: BLOOD  Result Value Ref Range Status   Specimen Description BLOOD BLOOD RIGHT HAND  Final   Special Requests   Final    BOTTLES DRAWN AEROBIC AND ANAEROBIC Blood Culture results may not be optimal due to an inadequate volume of blood received in culture bottles   Culture   Final    NO GROWTH 4 DAYS Performed at [facility], [address], [place], [place] [address]    Report Status PENDING  Incomplete  MRSA PCR Screening     Status:  None   Collection Time: 10/16/19  8:36 PM   Specimen: Nasopharyngeal  Result Value Ref Range Status   MRSA by PCR NEGATIVE NEGATIVE Final    Comment:        The GeneXpert MRSA Assay (FDA approved for NASAL specimens only), is one component of a comprehensive MRSA colonization surveillance program. It is not intended to diagnose MRSA infection nor to guide or monitor treatment for MRSA infections. Performed at [facility], [address] [place], [place] [address]     Procedures and diagnostic studies:  No results found.  Medications:   . heparin  5,000 Units Subcutaneous Q8H  . mouth rinse  15 mL Mouth Rinse BID  . sodium chloride flush  3 mL Intravenous Q12H   Continuous Infusions: . dextrose 5 % with KCl 20 mEq / L       LOS: 4 days   Mayra Jolliffe  [facility]   *Please refer to [email or web address], password [email or web address] to get updated schedule on who will round on this patient, as hospitalists switch teams weekly. If 7PM-7AM, please contact night-coverage at [email or web address], password [email or web address] for any overnight needs.  10/20/2019, 10:59 AM

## 2019-10-20 NOTE — Progress Notes (Addendum)
IV changed by IV team due to Iv to right AC leaking. Pt tolerated well. IV to right FA flushing and working well

## 2019-10-21 DIAGNOSIS — Z515 Encounter for palliative care: Secondary | ICD-10-CM

## 2019-10-21 DIAGNOSIS — R627 Adult failure to thrive: Secondary | ICD-10-CM

## 2019-10-21 DIAGNOSIS — R4182 Altered mental status, unspecified: Secondary | ICD-10-CM

## 2019-10-21 DIAGNOSIS — Z66 Do not resuscitate: Secondary | ICD-10-CM

## 2019-10-21 LAB — BASIC METABOLIC PANEL
Anion gap: 9 (ref 5–15)
BUN: 23 mg/dL (ref 8–23)
CO2: 21 mmol/L — ABNORMAL LOW (ref 22–32)
Calcium: 8.4 mg/dL — ABNORMAL LOW (ref 8.9–10.3)
Chloride: 112 mmol/L — ABNORMAL HIGH (ref 98–111)
Creatinine, Ser: 1.17 mg/dL (ref 0.61–1.24)
GFR calc Af Amer: >60 mL/min (ref >60)
GFR calc non Af Amer: 56 mL/min — ABNORMAL LOW (ref >60)
Glucose, Bld: 102 mg/dL — ABNORMAL HIGH (ref 70–99)
Potassium: 3.8 mmol/L (ref 3.5–5.1)
Sodium: 142 mmol/L (ref 135–145)

## 2019-10-21 LAB — CULTURE, BLOOD (ROUTINE X 2)
Culture: NO GROWTH
Culture: NO GROWTH
Special Requests: ADEQUATE

## 2019-10-21 LAB — MAGNESIUM: Magnesium: 2.4 mg/dL (ref 1.7–2.4)

## 2019-10-21 MED ORDER — LORAZEPAM 1 MG PO TABS: 1.0000 mg | ORAL_TABLET | Freq: Four times a day (QID) | ORAL | Status: DC | PRN

## 2019-10-21 MED ORDER — MORPHINE SULFATE (CONCENTRATE) 10 MG/0.5ML PO SOLN: 5.0000 mg | ORAL | Status: DC | PRN

## 2019-10-21 NOTE — Progress Notes (Addendum)
Progress Note    Shabazz Mckey  POE:[id number] DOB: 03-11-1930  DOA: 10/16/2019 PCP: Patient, No Pcp Per    [place] [facility] course  Egan Berkheimer is an 85 y.o. male with medical history significant for hypertension and dementia who was brought from the nursing home because of increasing confusion and altered mental status.  He was found to have severe hypernatremia and severe acute renal failure.  He was cautiously hydrated with IV fluids and hypernatremia and renal failure have resolved.    Assessment/Plan:   Principal Problem:   Hypernatremia Active Problems:   Acute renal failure (ARF) (HCC)   Lactic acidosis   Dementia with behavioral disturbance (HCC)   Body mass index is 23.82 kg/m.   Severe hypernatremia: Resolved.  Discontinue IV fluids  Severe acute renal failure: Resolved.  Creatinine is back to baseline.   Mildly elevated troponin: This was likely due to acute renal failure.    Hypokalemia: Improved.   Hypermagnesemia: Resolved  Leukocytosis/bandemia: Resolved  Severe lactic acidosis: This was likely due to acute renal failure.   Dementia with dysphagia: Speech therapist recommended dysphagia 1 diet and nectar thick liquids but patient is barely eating anything.  Consulted palliative care patient is a candidate for comfort care at the hospice house.  His healthcare power of attorney is yet to make a decision on this.  Discussed with Stanton Kidney, NP, from palliative care team.  History of coronavirus infection about a month ago: This was confirmed by [name], HPOA.     Family Communication/Anticipated D/C date and plan/Code Status   DVT prophylaxis: Heparin subcu Code Status: DNR Family Communication: None Disposition Plan: To be determined.  Possible discharge to hospice house.    Subjective:   He does not provide any history.  No acute events reported.   Objective:    Vitals:   10/20/19 1627 10/20/19 2010 10/21/19 0553 10/21/19 0808   BP: (!) 141/98 (!) 142/70 (!) 144/61 (!) 152/60  Pulse: 72 63 60 62  Resp: 19 16 18    Temp:  97.6 F (36.4 C) 97.9 F (36.6 C) (!) 97.5 F (36.4 C)  TempSrc:  Oral Oral Oral  SpO2: 100% 97% 97% 98%  Weight:   75.3 kg   Height:        Intake/Output Summary (Last 24 hours) at 10/21/2019 1551 Last data filed at 10/21/2019 0530 Gross per 24 hour  Intake 426.24 ml  Output 400 ml  Net 26.24 ml   Filed Weights   10/19/19 0357 10/20/19 0451 10/21/19 0553  Weight: 75.3 kg 75.3 kg 75.3 kg    Exam:   GEN: NAD SKIN: No rash EYES: EOMI ENT: MMM CV: RRR PULM: CTA B ABD: soft, ND, NT, +BS CNS: Alert and oriented to person only.  He is confused. EXT: No edema or tenderness     Data Reviewed:   I have personally reviewed following labs and imaging studies:  Labs: Labs show the following:   Basic Metabolic Panel: Recent Labs  Lab 10/16/19 2328 10/17/19 1555 10/18/19 0609 10/19/19 0456 10/20/19 0432 10/21/19 0833  NA 157* 154* 151* 150* 145 142  K 3.6 3.4* 3.5 3.9 3.8 3.8  CL 122* 120* 116* 116* 111 112*  CO2 20* 22 24 26 24  21*  GLUCOSE 122* 140* 119* 117* 99 102*  BUN 113* 91* 66* 42* 29* 23  CREATININE 3.21* 2.02* 1.41* 1.41* 1.22 1.17  CALCIUM 8.6* 8.4* 8.5* 8.5* 8.4* 8.4*  MG 3.5*  --  3.1*  --  2.6* 2.4   GFR Estimated Creatinine Clearance: 45.9 mL/min (by C-G formula based on SCr of 1.17 mg/dL). Liver Function Tests: Recent Labs  Lab 10/16/19 1224  AST 57*  ALT 36  ALKPHOS 70  BILITOT 1.5*  PROT 9.2*  ALBUMIN 4.5   No results for input(s): LIPASE, AMYLASE in the last 168 hours. No results for input(s): AMMONIA in the last 168 hours. Coagulation profile No results for input(s): INR, PROTIME in the last 168 hours.  CBC: Recent Labs  Lab 10/16/19 1224 10/17/19 0544 10/18/19 0609 10/19/19 0456  WBC 16.9* 15.3* 9.1 10.2  NEUTROABS 12.6*  --   --   --   HGB 19.3* 15.9 15.3 15.3  HCT 60.2* 49.2 46.8 46.0  MCV 92.3 90.4 89.3 88.8  PLT 306  194 165 169   Cardiac Enzymes: No results for input(s): CKTOTAL, CKMB, CKMBINDEX, TROPONINI in the last 168 hours. BNP (last 3 results) No results for input(s): PROBNP in the last 8760 hours. CBG: Recent Labs  Lab 10/18/19 2021  GLUCAP 100*   D-Dimer: No results for input(s): DDIMER in the last 72 hours. Hgb A1c: No results for input(s): HGBA1C in the last 72 hours. Lipid Profile: No results for input(s): CHOL, HDL, LDLCALC, TRIG, CHOLHDL, LDLDIRECT in the last 72 hours. Thyroid function studies: No results for input(s): TSH, T4TOTAL, T3FREE, THYROIDAB in the last 72 hours.  Invalid input(s): FREET3 Anemia work up: No results for input(s): VITAMINB12, FOLATE, FERRITIN, TIBC, IRON, RETICCTPCT in the last 72 hours. Sepsis Labs: Recent Labs  Lab 10/16/19 1224 10/16/19 1611 10/16/19 1741 10/16/19 2328 10/17/19 0544 10/17/19 0714 10/18/19 0609 10/19/19 0456  WBC 16.9*  --   --   --  15.3*  --  9.1 10.2  LATICACIDVEN 7.3* 4.3* 4.3* 3.5*  --  2.4*  --   --     Microbiology Recent Results (from the past 240 hour(s))  Blood culture (routine x 2)     Status: None   Collection Time: 10/16/19 12:25 PM   Specimen: BLOOD  Result Value Ref Range Status   Specimen Description BLOOD RIGHT ANTECUBITAL  Final   Special Requests   Final    BOTTLES DRAWN AEROBIC AND ANAEROBIC Blood Culture adequate volume   Culture   Final    NO GROWTH 5 DAYS Performed at [facility], [address] [place], [place] [address]    Report Status 10/21/2019 FINAL  Final  SARS CORONAVIRUS 2 (TAT 6-24 HRS) Nasopharyngeal Nasopharyngeal Swab     Status: Abnormal   Collection Time: 10/16/19  4:43 PM   Specimen: Nasopharyngeal Swab  Result Value Ref Range Status   SARS Coronavirus 2 POSITIVE (A) NEGATIVE Final    Comment: RESULT CALLED TO, READ BACK BY AND VERIFIED WITH: J.PAGE,RN [id number] 10/17/19 G.MCADOO (NOTE) SARS-CoV-2 target nucleic acids are DETECTED. The SARS-CoV-2 RNA is generally  detectable in upper and lower respiratory specimens during the acute phase of infection. Positive results are indicative of the presence of SARS-CoV-2 RNA. Clinical correlation with patient history and other diagnostic information is  necessary to determine patient infection status. Positive results do not rule out bacterial infection or co-infection with other viruses.  The expected result is Negative. Fact Sheet for Patients: HairSlick.nohttps://www.fda.gov/media/138098/download Fact Sheet for Healthcare Providers: quierodirigir.comhttps://www.fda.gov/media/138095/download This test is not yet approved or cleared by the [place] FDA and  has been authorized for detection and/or diagnosis of SARS-CoV-2 by FDA under an Emergency Use Authorization (EUA). This EUA  will remain  in effect (meaning this test can be used) for the du ration of the COVID-19 declaration under Section 564(b)(1) of the Act, 21 U.S.C. section 360bbb-3(b)(1), unless the authorization is terminated or revoked sooner. Performed at [facility], [address] [place], [place] [address]   Blood culture (routine x 2)     Status: None   Collection Time: 10/16/19  5:41 PM   Specimen: BLOOD  Result Value Ref Range Status   Specimen Description BLOOD BLOOD RIGHT HAND  Final   Special Requests   Final    BOTTLES DRAWN AEROBIC AND ANAEROBIC Blood Culture results may not be optimal due to an inadequate volume of blood received in culture bottles   Culture   Final    NO GROWTH 5 DAYS Performed at [facility], [address] [place], [place] [address]    Report Status 10/21/2019 FINAL  Final  MRSA PCR Screening     Status: None   Collection Time: 10/16/19  8:36 PM   Specimen: Nasopharyngeal  Result Value Ref Range Status   MRSA by PCR NEGATIVE NEGATIVE Final    Comment:        The GeneXpert MRSA Assay (FDA approved for NASAL specimens only), is one component of a comprehensive MRSA colonization surveillance program. It  is not intended to diagnose MRSA infection nor to guide or monitor treatment for MRSA infections. Performed at [facility], [address] [place], [place] [address]     Procedures and diagnostic studies:  No results found.  Medications:   . heparin  5,000 Units Subcutaneous Q8H  . mouth rinse  15 mL Mouth Rinse BID  . sodium chloride flush  3 mL Intravenous Q12H   Continuous Infusions:    LOS: 5 days   Dez Stauffer  [facility]   *Please refer to [email or web address], password [email or web address] to get updated schedule on who will round on this patient, as hospitalists switch teams weekly. If 7PM-7AM, please contact night-coverage at [email or web address], password [email or web address] for any overnight needs.  10/21/2019, 3:51 PM

## 2019-10-21 NOTE — Consult Note (Signed)
Consultation Note Date: 10/21/2019   Patient Name: Marc Perez  DOB: 29-Jun-1930  MRN: [id number]  Age / Sex: 85 y.o., male  PCP: Patient, No Pcp Per Referring Physician: Jennye Boroughs, MD  Reason for Consultation: Establishing goals of care and Psychosocial/spiritual support  HPI/Patient Profile: 85 y.o. male  admitted on 10/16/2019  with past  medical history significant for hypertension and dementia who was brought from the nursing home because of increasing confusion and altered mental status.    According  to his healthcare power of attorney, Marc Perez,  patient has dementia at baseline but is able to communicate to some extent.  He also said that the patient has no family and he is the sole Marine scientist.  Patient is a DO NOT RESUSCITATE.  ED Course:  The patient was found to have severe hypernatremia and acute renal failure.  He also had leukocytosis and lactic acidosis.  Chest x-ray showed right lower lobe consolidation.  He is Covid-19 positive  Patient continues to be lethargic and confused.  He is taking very little by mouth.  He is high risk to decompendsate  HPOA faces treatment option decisions, advanced directive decisions and anticipatory care needs.    Marc Perez is the patient HPOA and main support person and friend   Clinical Assessment and Goals of Care:   This NP Wadie Lessen reviewed medical records, received report from team,  and then spoke to [place] support person  to discuss diagnosis, prognosis, GOC, EOL wishes disposition and options.  Concept of Hospice and Palliative Care were discussed  We discussed the natural trajectory and expectations of dementia.  Discussed concept specific to human mortality and failure to thrive.  We discussed the limitations of medical interventions to prolong quality of life when the body does begin to fail to  thrive  A  discussion was had today regarding advanced directives.  Concepts specific to code status, artifical feeding and hydration, continued IV antibiotics and rehospitalization was had.  The difference between a aggressive medical intervention path  and a palliative comfort care path for this patient at this time was had.  Values and goals of care important to patient and family were attempted to be elicited.  Discussed  with nursing and a video chat was set up today so the healthcare power of attorney can visually see Marc Perez.  After the video visit, HPOA made decision to shift to full comfort allowing for a natural death.  He was grateful for the opportunity to "see" the patientt.  He tells me he now "knows" the seriousness of the situation.  Natural trajectory and expectations at EOL were discussed.  Questions and concerns addressed.   Family encouraged to call with questions or concerns.    PMT will continue to support holistically.   SUMMARY OF RECOMMENDATIONS    Code Status/Advance Care Planning:  DNR   Comfort and dignity are focus of care.  No further diagnostics, or life prolonging measures-allow a natural death    Symptom Management:  Pain/Dyspnea: Roxanol 5 mg every 2 hrs  po/sl as needed  Agitation: Ativan 1 mg po/sl every 6 hrs prn   Palliative Prophylaxis:   Aspiration, Bowel Regimen, Delirium Protocol, Frequent Pain Assessment and Oral Care  Additional Recommendations (Limitations, Scope, Preferences):  - Full comfort   Psycho-social/Spiritual:   Desire for further Chaplaincy support:yes  Additional Recommendations: Education on Hospice  Prognosis:   Patient is hospice eleigible  Re-evaluate in the morning for residential hospice eligibility   Discharge Planning: To Be Determined      Primary Diagnoses: Present on Admission: . Hypernatremia . Acute renal failure (ARF) (HCC) . Lactic acidosis . Dementia with behavioral disturbance  (HCC)   I have reviewed the medical record, interviewed the patient and family, and examined the patient. The following aspects are pertinent.  Past Medical History:  Diagnosis Date  . Dementia [facility](HCC)    Social History   Socioeconomic History  . Marital status: Widowed    Spouse name: Not on file  . Number of children: Not on file  . Years of education: Not on file  . Highest education level: Not on file  Occupational History  . Not on file  Tobacco Use  . Smoking status: Unknown If Ever Smoked  . Smokeless tobacco: Never Used  Substance and Sexual Activity  . Alcohol use: Not on file  . Drug use: Not on file  . Sexual activity: Not on file  Other Topics Concern  . Not on file  Social History Narrative  . Not on file   Social Determinants of Health   Financial Resource Strain:   . Difficulty of Paying Living Expenses: Not on file  Food Insecurity:   . Worried About Programme researcher, broadcasting/film/videounning Out of Food in the Last Year: Not on file  . Ran Out of Food in the Last Year: Not on file  Transportation Needs:   . Lack of Transportation (Medical): Not on file  . Lack of Transportation (Non-Medical): Not on file  Physical Activity:   . Days of Exercise per Week: Not on file  . Minutes of Exercise per Session: Not on file  Stress:   . Feeling of Stress : Not on file  Social Connections:   . Frequency of Communication with Friends and Family: Not on file  . Frequency of Social Gatherings with Friends and Family: Not on file  . Attends Religious Services: Not on file  . Active Member of Clubs or Organizations: Not on file  . Attends BankerClub or Organization Meetings: Not on file  . Marital Status: Not on file   History reviewed. No pertinent family history. Scheduled Meds: . heparin  5,000 Units Subcutaneous Q8H  . mouth rinse  15 mL Mouth Rinse BID  . sodium chloride flush  3 mL Intravenous Q12H   Continuous Infusions: . dextrose 5 % with KCl 20 mEq / L 20 mEq (10/20/19 1801)   PRN  Meds:.acetaminophen **OR** acetaminophen, ondansetron **OR** ondansetron (ZOFRAN) IV, senna-docusate Medications Prior to Admission:  Prior to Admission medications   Medication Sig Start Date End Date Taking? Authorizing Provider  acetaminophen (TYLENOL) 325 MG tablet Take 650 mg by mouth every 4 (four) hours as needed.   Yes [provider]  amLODipine (NORVASC) 5 MG tablet Take 5 mg by mouth daily. 10/09/19  Yes [provider]  aspirin 81 MG EC tablet Take 81 mg by mouth daily.   Yes [provider]  busPIRone (BUSPAR) 5 MG tablet Take 5 mg by mouth 3 (  three) times daily. 10/09/19  Yes [provider]  Calcium Carbonate-Vitamin D3 (CALCIUM 600-D) 600-400 MG-UNIT TABS Take 1 tablet by mouth daily.   Yes [provider]  donepezil (ARICEPT) 10 MG tablet Take 10 mg by mouth at bedtime. 10/09/19  Yes [provider]  Ginkgo Biloba Extract (GINKGO BILOBA MEMORY ENHANCER) 60 MG CAPS Take 60 mg by mouth daily. 02/18/14  Yes [provider]  Lutein 20 MG CAPS Take 20 mg by mouth daily. 02/18/14  Yes [provider]  Omega-3 Fatty Acids (FISH OIL) 1000 MG CAPS Take 1,000 mg by mouth 2 (two) times daily.   Yes [provider]  Red Yeast Rice Extract 600 MG CAPS Take 1,200 mg by mouth daily. 02/18/14  Yes [provider]  vitamin E 400 UNIT capsule Take 400 Units by mouth daily. 02/18/14  Yes [provider]   No Known Allergies Review of Systems  Unable to perform ROS: Acuity of condition    Physical Exam  - no physical exam completed 2/2 to Covid-19 and attempt to preserve PPE  Vital Signs: BP (!) 152/60 (BP Location: Left Arm)   Pulse 62   Temp (!) 97.5 F (36.4 C) (Oral)   Resp 18   Ht 5\' 10"  (1.778 m)   Wt 75.3 kg   SpO2 98%   BMI 23.82 kg/m  Pain Scale: PAINAD   Pain Score: 0-No pain   SpO2: SpO2: 98 % O2 Device:SpO2: 98 % O2 Flow Rate: .   IO: Intake/output summary:    Intake/Output Summary (Last 24 hours) at 10/21/2019 1033 Last data filed at 10/21/2019 0530 Gross per 24 hour  Intake 426.24 ml  Output 400 ml  Net 26.24 ml    LBM: Last BM Date: 10/17/19 Baseline Weight: Weight: 74.8 kg Most recent weight: Weight: 75.3 kg     Palliative Assessment/Data: 30 % at best   Discussed with Dr 10/19/19  Time In: 1100 Time Out: 1215 Time Total: 75 minutes Greater than 50%  of this time was spent counseling and coordinating care related to the above assessment and plan.  Signed by: 1216, NP   Please contact Palliative Medicine Team phone at [phone] for questions and concerns.  For individual provider: See [phone]

## 2019-10-21 NOTE — Progress Notes (Signed)
PT Cancellation Note  Patient Details Name: Marc Perez MRN: [id number] DOB: 09-Oct-1930   Cancelled Treatment:    Reason Eval/Treat Not Completed: Medical issues which prohibited therapy(Per chart review, patient noted with transition to comfort care.  PT consult discontinued per palliative care team.  Please re-consult should needs and goals of care change.)   Akiah Bauch H. Owens Shark, PT, DPT, NCS 10/21/19, 9:57 PM [phone]

## 2019-10-21 NOTE — Plan of Care (Signed)
  Problem: Urinary Elimination: Goal: Signs and symptoms of infection will decrease Outcome: Progressing   Problem: Elimination: Goal: Will not experience complications related to urinary retention Outcome: Progressing   Problem: Safety: Goal: Ability to remain free from injury will improve Outcome: Progressing   Problem: Skin Integrity: Goal: Risk for impaired skin integrity will decrease Outcome: Progressing

## 2019-10-21 NOTE — Care Management Important Message (Signed)
Important Message  Patient Details  Name: Marc Perez MRN: [id number] Date of Birth: Oct 06, 1930   Medicare Important Message Given:  Yes - Important Message mailed due to current National Emergency  Reviewed verbally with [name], [place], at [phone].  Requested copy of Medicare IM be mailed to patient's home address on chart.     Dannette Barbara 10/21/2019, 1:29 PM

## 2019-10-21 NOTE — Evaluation (Signed)
Occupational Therapy Evaluation Patient Details Name: Marc Perez MRN: [id number] DOB: 05-14-1930 Today's Date: 10/21/2019    History of Present Illness Per Internal Med MD note: Marc Perez is an 85 y.o. male with medical history significant for hypertension and dementia who was brought from the nursing home because of increasing confusion and altered mental status.  He was found to have severe hypernatremia and severe acute renal failure.  He was cautiously hydrated with IV fluids and hypernatremia and renal failure have resolved. Per SW note, pt acutally lived at [facility].   Clinical Impression   Pt seen for OT evaluation this date. Pt resided in [facility]. Pt is questionable historian-PLOF with fxl mobility difficult to acertain. Pt does however endorse that he requires assistance with bathing and dressing.   Currently pt demonstrates impairments in fxl activity tolerance and general strength requiring MOD A for sup<>sit and Supv-MIN A for UB ADLs in sitting as well as MOD/MAX A for LB ADLs.  Pt would benefit from skilled OT to address noted impairments and functional limitations (see below for any additional details) in order to maximize safety and independence while minimizing falls risk and caregiver burden.  Upon hospital discharge, recommend pt discharge to SNF.    Follow Up Recommendations  SNF    Equipment Recommendations  Other (comment)(defer to next venue of care.)    Recommendations for Other Services       Precautions / Restrictions Precautions Precautions: Fall Restrictions Weight Bearing Restrictions: No  COVID precautions     Mobility Bed Mobility Overal bed mobility: Needs Assistance Bed Mobility: Supine to Sit;Sit to Supine     Supine to sit: Mod assist Sit to supine: Mod assist      Transfers                 General transfer comment: pt declines to attempt t/f on OT assessment    Balance Overall balance assessment: Needs  assistance Sitting-balance support: Bilateral upper extremity supported Sitting balance-Leahy Scale: Fair         Standing balance comment: Unable to assess                           ADL either performed or assessed with clinical judgement   ADL Overall ADL's : Needs assistance/impaired Eating/Feeding: Set up;Sitting   Grooming: Wash/dry hands;Wash/dry face;Oral care;Supervision/safety;Sitting;Cueing for sequencing Grooming Details (indicate cue type and reason): supv for verbal cues to sequence Upper Body Bathing: Moderate assistance;Sitting   Lower Body Bathing: Moderate assistance;Sitting/lateral leans   Upper Body Dressing : Minimal assistance;Sitting   Lower Body Dressing: Moderate assistance;Sitting/lateral leans Lower Body Dressing Details (indicate cue type and reason): Pt requires MOD A to don/doff socks in sitting as well as consistent multimodal cues for sequence/attn to task. Pt declines to attempt stand for assessment of standing as it pertains to ADLs.   Toilet Transfer Details (indicate cue type and reason): Unable to assess Toileting- Clothing Manipulation and Hygiene: Maximal assistance;Bed level     Tub/Shower Transfer Details (indicate cue type and reason): Unable to assess Functional mobility during ADLs: (Unable to assess, pt declines to CTS during OT assessment)       Vision   Additional Comments: unable to formally assess d/t difficulty with higher level cue following. Pt tracks [facility] for items assessed.     Perception     Praxis      Pertinent Vitals/Pain Pain Assessment: No/denies pain  Hand Dominance     Extremity/Trunk Assessment Upper Extremity Assessment Upper Extremity Assessment: Generalized weakness   Lower Extremity Assessment Lower Extremity Assessment: Generalized weakness       Communication Communication Communication: No difficulties   Cognition Arousal/Alertness: Awake/alert Behavior During Therapy: WFL  for tasks assessed/performed Overall Cognitive Status: No family/caregiver present to determine baseline cognitive functioning                                 General Comments: Pt requires increased time for processing, repeats himself-perseverates on ice cream. Pt oriented to self, but disoriented to time, place and situation. Pt does eventually deduce he is in the hospital with cueing.   General Comments       Exercises Other Exercises Other Exercises: OT attempts to engage pt in cervical/thoracic-posture extension exercises while pt seated EOB to improve respiratory status. Pt engages for 5-6 of 10 requested reps, difficulty attending, requires cues for form throughout.   Shoulder Instructions      Home Living Family/patient expects to be discharged to:: Assisted living                                 Additional Comments: Pt is questionable historian, unable to provide information re: use of AD for fxl mobility.      Prior Functioning/Environment Level of Independence: Needs assistance  Gait / Transfers Assistance Needed: Pt unable to clarify use of AD ADL's / Homemaking Assistance Needed: Pt states that he required assistance for bathing and dressing. In addition, ALF assisted with Med mgt and meals.            OT Problem List: Decreased strength;Decreased range of motion;Decreased activity tolerance;Impaired balance (sitting and/or standing);Decreased cognition;Decreased safety awareness;Decreased knowledge of use of DME or AE;Decreased knowledge of precautions      OT Treatment/Interventions: Self-care/ADL training;Therapeutic exercise;Energy conservation;DME and/or AE instruction;Therapeutic activities;Patient/family education;Balance training    OT Goals(Current goals can be found in the care plan section) Acute Rehab OT Goals Patient Stated Goal: none stated  OT Frequency: Min 1X/week   Barriers to D/C:            Co-evaluation               AM-PAC OT "6 Clicks" Daily Activity     Outcome Measure Help from another person eating meals?: None Help from another person taking care of personal grooming?: A Little Help from another person toileting, which includes using toliet, bedpan, or urinal?: A Lot Help from another person bathing (including washing, rinsing, drying)?: A Lot Help from another person to put on and taking off regular upper body clothing?: A Little Help from another person to put on and taking off regular lower body clothing?: A Lot 6 Click Score: 16   End of Session Nurse Communication: (notified CNA that VS taken and that pt requested vanilla icecream, OT called dining services to request for pt)  Activity Tolerance: Patient tolerated treatment well Patient left: in bed;with call bell/phone within reach;with bed alarm set  OT Visit Diagnosis: Unsteadiness on feet (R26.81);Muscle weakness (generalized) (M62.81)                Time: 5053-9767 OT Time Calculation (min): 29 min Charges:  OT General Charges $OT Visit: 1 Visit OT Evaluation $OT Eval Moderate Complexity: 1 Mod OT Treatments $Self Care/Home Management : 8-22 mins  Rejeana BrockAlison Johm Pfannenstiel, MS, OTR/L ascom [phone] 10/21/19, 5:52 PM

## 2019-10-21 NOTE — Evaluation (Signed)
Physical Therapy Evaluation Patient Details Name: Marc Perez MRN: [id number] DOB: Mar 11, 1930 Today's Date: 10/21/2019   History of Present Illness  Per Internal Med MD note: Kamarii Perez is an 85 y.o. male with medical history significant for hypertension and dementia who was brought from the nursing home because of increasing confusion and altered mental status.  He was found to have severe hypernatremia and severe acute renal failure; noted to be COVID +.  He was cautiously hydrated with IV fluids and hypernatremia and renal failure have resolved. Per SW note, pt acutally lived at [facility].  Clinical Impression  Upon evaluation, patient alert and oriented to self only; follows 50-60% simple verbal commands, often requiring hand-over-hand, gestures or therapist for full demonstration.  Bilat UE/LE strength and ROM grossly symmetrical and WFL for basic transfers and gait; no focal weakness, pain indicators appreciated.  Currently requiring min assist for bed mobility, sit/stand and 1-2 lateral steps edge of bed with L HHA, min assist.  Demonstrates forward flexed posture, broad BOS, staggered stepping with poor balance; requiring UE support and +1 assist for safety/stability. Would benefit from skilled PT to address above deficits and promote optimal return to PLOF; recommend transition to STR upon discharge from acute hospitalization.     Follow Up Recommendations SNF    Equipment Recommendations       Recommendations for Other Services       Precautions / Restrictions Precautions Precautions: Fall Restrictions Weight Bearing Restrictions: No      Mobility  Bed Mobility Overal bed mobility: Needs Assistance Bed Mobility: Supine to Sit     Supine to sit: Min assist Sit to supine: Min assist;Mod assist      Transfers Overall transfer level: Needs assistance   Transfers: Sit to/from Stand Sit to Stand: Min assist            Ambulation/Gait Ambulation/Gait  assistance: Min assist Gait Distance (Feet): 2 Feet Assistive device: 1 person hand held assist       General Gait Details: forward flexed posture, broad BOS, staggered stepping with poor balance; requiring UE support and +1 assist for safety/stability.  Refuses further distance/attempts.  Stairs            Wheelchair Mobility    Modified Rankin (Stroke Patients Only)       Balance Overall balance assessment: Needs assistance Sitting-balance support: No upper extremity supported;Feet supported Sitting balance-Leahy Scale: Fair     Standing balance support: Single extremity supported                                 Pertinent Vitals/Pain Pain Assessment: No/denies pain    Home Living Family/patient expects to be discharged to:: Assisted living                 Additional Comments: Pt is questionable historian, unable to provide information re: use of AD for fxl mobility.    Prior Function Level of Independence: Needs assistance         Comments: Per chart, patient required assist for ADLs at ALF; patient unable to provide information otherwise.     Hand Dominance   Dominant Hand: Left    Extremity/Trunk Assessment   Upper Extremity Assessment Upper Extremity Assessment: Generalized weakness    Lower Extremity Assessment Lower Extremity Assessment: Generalized weakness(grossly 4-/5 throughout)       Communication   Communication: No difficulties  Cognition Arousal/Alertness: Awake/alert Behavior During Therapy:  WFL for tasks assessed/performed Overall Cognitive Status: No family/caregiver present to determine baseline cognitive functioning                                 General Comments: Oriented to self only; follows approx 60-75% simple verbal commands, enhanced with hands-over-hand, gestures and therapist demonstration. Limited insight into deficits, situation      General Comments      Exercises Other  Exercises Other Exercises: Sit/stand x2 with L HHA, min assist-forward flexed posture, broad BOS.  Poor balance, safety awareness Other Exercises: Dep for linen change, hygiene after incontinet bladder episode.   Assessment/Plan    PT Assessment Patient needs continued PT services  PT Problem List Decreased strength;Decreased activity tolerance;Decreased balance;Decreased mobility;Decreased coordination;Decreased cognition;Decreased knowledge of use of DME;Decreased safety awareness;Decreased knowledge of precautions       PT Treatment Interventions DME instruction;Gait training;Functional mobility training;Therapeutic activities;Therapeutic exercise;Balance training;Cognitive remediation;Patient/family education    PT Goals (Current goals can be found in the Care Plan section)  Acute Rehab PT Goals PT Goal Formulation: Patient unable to participate in goal setting Time For Goal Achievement: 11/04/19 Potential to Achieve Goals: Fair    Frequency Min 2X/week   Barriers to discharge        Co-evaluation               AM-PAC PT "6 Clicks" Mobility  Outcome Measure Help needed turning from your back to your side while in a flat bed without using bedrails?: A Little Help needed moving from lying on your back to sitting on the side of a flat bed without using bedrails?: A Little Help needed moving to and from a bed to a chair (including a wheelchair)?: A Little Help needed standing up from a chair using your arms (e.g., wheelchair or bedside chair)?: A Little Help needed to walk in hospital room?: A Lot Help needed climbing 3-5 steps with a railing? : A Lot 6 Click Score: 16    End of Session Equipment Utilized During Treatment: Gait belt Activity Tolerance: Patient tolerated treatment well Patient left: in bed;with call bell/phone within reach;with bed alarm set   PT Visit Diagnosis: Difficulty in walking, not elsewhere classified (R26.2);Muscle weakness (generalized)  (M62.81)    Time: 9675-9163 PT Time Calculation (min) (ACUTE ONLY): 20 min   Charges:   PT Evaluation $PT Eval Moderate Complexity: 1 Mod PT Treatments $Therapeutic Activity: 8-22 mins       Shiasia Porro H. Owens Shark, PT, DPT, NCS 10/21/19, 9:55 PM [phone]

## 2019-10-22 DIAGNOSIS — Z66 Do not resuscitate: Secondary | ICD-10-CM

## 2019-10-22 NOTE — TOC Progression Note (Addendum)
Transition of Care [facility]) - Progression Note    Patient Details  Name: Harvir Patry MRN: [id number] Date of Birth: May 26, 1930  Transition of Care [facility]) CM/SW [place], LCSW Phone Number: 10/22/2019, 11:31 AM  Clinical Narrative: Discussed case with Wadie Lessen, NP with palliative team. Left voicemail at [place]. Will see if he can return with hospice when they return call.    12:22 pm: CSW updated Biomedical scientist on disposition plan. Patient was COVID positive prior to admission and was cleared on 12/18. CSW faxed therapy notes, palliative note, and most recent MD progress note for them to review. They will call CSW back once reviewed.  1:18 pm: Confirmed Douglass Rivers received paperwork that was faxed over.  4:02 pm: CSW called Vaughan Basta at [facility]. She said they can handle assisting him with bathing and dressing but are unable to take him back if he needs a lot of assistance ambulating or requiring more supervision than they can provide. Asked RN to call Vaughan Basta to provide this information before Vaughan Basta leaves the facility for the day. If not, will try again tomorrow.  Expected Discharge Plan: Assisted Living Barriers to Discharge: Continued Medical Work up  Expected Discharge Plan and Services Expected Discharge Plan: Assisted Living       Living arrangements for the past 2 months: Assisted Living Facility                                       Social Determinants of Health (SDOH) Interventions    Readmission Risk Interventions No flowsheet data found.

## 2019-10-22 NOTE — Progress Notes (Signed)
Progress Note    Marc Perez  FBX:[id number] DOB: 1930-03-13  DOA: 10/16/2019 PCP: Patient, No Pcp Per    Brief Hospital course  Araf Clugston is an 85 y.o. male with medical history significant for hypertension and dementia who was brought from the nursing home because of increasing confusion and altered mental status.  He was found to have severe hypernatremia and severe acute renal failure.  He was cautiously hydrated with IV fluids and hypernatremia and renal failure have resolved.  He has dementia with significant dysphagia.  He was evaluated by palliative care team and after discussion with his healthcare power of attorney, his healthcare power of attorney opted for comfort measures with hospice.    Assessment/Plan:   Principal Problem:   Hypernatremia Active Problems:   Acute renal failure (ARF) (HCC)   Lactic acidosis   Dementia with behavioral disturbance (HCC)   Palliative care by specialist   Altered mental status   DNR (do not resuscitate)   Adult failure to thrive   Body mass index is 23.82 kg/m.   Severe hypernatremia: Resolved.   Severe acute renal failure: Resolved.    Mildly elevated troponin: This was likely due to acute renal failure.    Hypokalemia: Improved.   Hypermagnesemia: Resolved  Leukocytosis/bandemia: Resolved  Severe lactic acidosis: This was likely due to acute renal failure.   Dementia with dysphagia: Patient's healthcare power of attorney has opted for comfort measures with hospice.  Awaiting placement to hospice home.  History of coronavirus infection about a month ago: This was confirmed by [name], HPOA.     Family Communication/Anticipated D/C date and plan/Code Status   DVT prophylaxis: Heparin subcu Code Status: DNR Family Communication: None Disposition Plan: Awaiting placement to hospice home.  Possible discharge to hospice home in 24 hours.   Subjective:   No acute issues overnight.  He has no  complaints.   Objective:    Vitals:   10/21/19 0808 10/21/19 1626 10/21/19 2048 10/22/19 0735  BP: (!) 152/60 (!) 146/60 (!) 156/78 (!) 122/99  Pulse: 62 60 66 67  Resp:  18 14 19   Temp: (!) 97.5 F (36.4 C) 98.2 F (36.8 C) (!) 97.5 F (36.4 C) 98.3 F (36.8 C)  TempSrc: Oral  Oral   SpO2: 98% 99% 98% 99%  Weight:      Height:        Intake/Output Summary (Last 24 hours) at 10/22/2019 1516 Last data filed at 10/22/2019 0941 Gross per 24 hour  Intake 3 ml  Output --  Net 3 ml   Filed Weights   10/19/19 0357 10/20/19 0451 10/21/19 0553  Weight: 75.3 kg 75.3 kg 75.3 kg    Exam:   GEN: NAD SKIN: No rash EYES: Anicteric ENT: MMM CV: RRR PULM: CTA B ABD: soft, ND, NT, +BS CNS: AAO x ,1 non focal EXT: No edema or tenderness     Data Reviewed:   I have personally reviewed following labs and imaging studies:  Labs: Labs show the following:   Basic Metabolic Panel: Recent Labs  Lab 10/16/19 2328 10/17/19 1555 10/18/19 0609 10/19/19 0456 10/20/19 0432 10/21/19 0833  NA 157* 154* 151* 150* 145 142  K 3.6 3.4* 3.5 3.9 3.8 3.8  CL 122* 120* 116* 116* 111 112*  CO2 20* 22 24 26 24  21*  GLUCOSE 122* 140* 119* 117* 99 102*  BUN 113* 91* 66* 42* 29* 23  CREATININE 3.21* 2.02* 1.41* 1.41* 1.22 1.17  CALCIUM 8.6* 8.4* 8.5* 8.5* 8.4* 8.4*  MG 3.5*  --  3.1*  --  2.6* 2.4   GFR Estimated Creatinine Clearance: 45.9 mL/min (by C-G formula based on SCr of 1.17 mg/dL). Liver Function Tests: Recent Labs  Lab 10/16/19 1224  AST 57*  ALT 36  ALKPHOS 70  BILITOT 1.5*  PROT 9.2*  ALBUMIN 4.5   No results for input(s): LIPASE, AMYLASE in the last 168 hours. No results for input(s): AMMONIA in the last 168 hours. Coagulation profile No results for input(s): INR, PROTIME in the last 168 hours.  CBC: Recent Labs  Lab 10/16/19 1224 10/17/19 0544 10/18/19 0609 10/19/19 0456  WBC 16.9* 15.3* 9.1 10.2  NEUTROABS 12.6*  --   --   --   HGB 19.3* 15.9  15.3 15.3  HCT 60.2* 49.2 46.8 46.0  MCV 92.3 90.4 89.3 88.8  PLT 306 194 165 169   Cardiac Enzymes: No results for input(s): CKTOTAL, CKMB, CKMBINDEX, TROPONINI in the last 168 hours. BNP (last 3 results) No results for input(s): PROBNP in the last 8760 hours. CBG: Recent Labs  Lab 10/18/19 2021  GLUCAP 100*   D-Dimer: No results for input(s): DDIMER in the last 72 hours. Hgb A1c: No results for input(s): HGBA1C in the last 72 hours. Lipid Profile: No results for input(s): CHOL, HDL, LDLCALC, TRIG, CHOLHDL, LDLDIRECT in the last 72 hours. Thyroid function studies: No results for input(s): TSH, T4TOTAL, T3FREE, THYROIDAB in the last 72 hours.  Invalid input(s): FREET3 Anemia work up: No results for input(s): VITAMINB12, FOLATE, FERRITIN, TIBC, IRON, RETICCTPCT in the last 72 hours. Sepsis Labs: Recent Labs  Lab 10/16/19 1224 10/16/19 1611 10/16/19 1741 10/16/19 2328 10/17/19 0544 10/17/19 0714 10/18/19 0609 10/19/19 0456  WBC 16.9*  --   --   --  15.3*  --  9.1 10.2  LATICACIDVEN 7.3* 4.3* 4.3* 3.5*  --  2.4*  --   --     Microbiology Recent Results (from the past 240 hour(s))  Blood culture (routine x 2)     Status: None   Collection Time: 10/16/19 12:25 PM   Specimen: BLOOD  Result Value Ref Range Status   Specimen Description BLOOD RIGHT ANTECUBITAL  Final   Special Requests   Final    BOTTLES DRAWN AEROBIC AND ANAEROBIC Blood Culture adequate volume   Culture   Final    NO GROWTH 5 DAYS Performed at [facility], [address] [place], [place] [address]    Report Status 10/21/2019 FINAL  Final  SARS CORONAVIRUS 2 (TAT 6-24 HRS) Nasopharyngeal Nasopharyngeal Swab     Status: Abnormal   Collection Time: 10/16/19  4:43 PM   Specimen: Nasopharyngeal Swab  Result Value Ref Range Status   SARS Coronavirus 2 POSITIVE (A) NEGATIVE Final    Comment: RESULT CALLED TO, READ BACK BY AND VERIFIED WITH: J.PAGE,RN 1660 10/17/19  G.MCADOO (NOTE) SARS-CoV-2 target nucleic acids are DETECTED. The SARS-CoV-2 RNA is generally detectable in upper and lower respiratory specimens during the acute phase of infection. Positive results are indicative of the presence of SARS-CoV-2 RNA. Clinical correlation with patient history and other diagnostic information is  necessary to determine patient infection status. Positive results do not rule out bacterial infection or co-infection with other viruses.  The expected result is Negative. Fact Sheet for Patients: SugarRoll.be Fact Sheet for Healthcare Providers: https://www.woods-mathews.com/ This test is not yet approved or cleared by the [place] FDA and  has been authorized for detection and/or  diagnosis of SARS-CoV-2 by FDA under an Emergency Use Authorization (EUA). This EUA will remain  in effect (meaning this test can be used) for the du ration of the COVID-19 declaration under Section 564(b)(1) of the Act, 21 U.S.C. section 360bbb-3(b)(1), unless the authorization is terminated or revoked sooner. Performed at [facility], [address] [place], [place] [address]   Blood culture (routine x 2)     Status: None   Collection Time: 10/16/19  5:41 PM   Specimen: BLOOD  Result Value Ref Range Status   Specimen Description BLOOD BLOOD RIGHT HAND  Final   Special Requests   Final    BOTTLES DRAWN AEROBIC AND ANAEROBIC Blood Culture results may not be optimal due to an inadequate volume of blood received in culture bottles   Culture   Final    NO GROWTH 5 DAYS Performed at [facility], [address] [place], [place] [address]    Report Status 10/21/2019 FINAL  Final  MRSA PCR Screening     Status: None   Collection Time: 10/16/19  8:36 PM   Specimen: Nasopharyngeal  Result Value Ref Range Status   MRSA by PCR NEGATIVE NEGATIVE Final    Comment:        The GeneXpert MRSA Assay (FDA approved for NASAL  specimens only), is one component of a comprehensive MRSA colonization surveillance program. It is not intended to diagnose MRSA infection nor to guide or monitor treatment for MRSA infections. Performed at [facility], [address] [place], [place] [address]     Procedures and diagnostic studies:  No results found.  Medications:   . mouth rinse  15 mL Mouth Rinse BID  . sodium chloride flush  3 mL Intravenous Q12H   Continuous Infusions:    LOS: 6 days   Simcha Speir  [facility]   *Please refer to [email or web address], password [email or web address] to get updated schedule on who will round on this patient, as hospitalists switch teams weekly. If 7PM-7AM, please contact night-coverage at [email or web address], password [email or web address] for any overnight needs.  10/22/2019, 3:16 PM

## 2019-10-22 NOTE — Plan of Care (Signed)
  Problem: Safety: Goal: Ability to remain free from injury will improve Outcome: Progressing   

## 2019-10-22 NOTE — Progress Notes (Signed)
SLP Cancellation Note  Patient Details Name: Marc Perez MRN: [id number] DOB: 04-Jun-1930   Cancelled treatment:       Reason Eval/Treat Not Completed: (chart reviewed; consulted NSG and Palliative Care NSG). Pt continues to be drowsy/lethargic and confused. He is taking "very little by mouth" per NSG and Palliative Care reports and is at high risk to decompensate w/ medical presentation currently, per Palliative Care. HPOA made decision to shift to full comfort allowing for a natural death, per Palliative Care report today. ST services will sign off at this time w/ NSG to reconsult if any new needs arise. Recommend continue a more dysphagia diet for ease, safety of intake d/t pt's baseline Dementia and cognitive decline. Recommend any oral meds Crushed in Puree; frequent oral care for hygiene and stimulation of swallowing.     Orinda Kenner, MS, CCC-SLP Kamren Heskett 10/22/2019, 11:03 AM

## 2019-10-22 NOTE — Progress Notes (Signed)
Patient ID: Marc Perez, male   DOB: [birth year], 85 y.o.   MRN: [id number]  This NP visited patient at the bedside as a follow up to  yesterday's [place].  Patient is intermittently confused, is weak and continues to take sips and bites.  As documented yesterday the main focus of care is for comfort, quality and dignity at this time.    Placed call to HPOA/ [name].  Douglass Rivers, ALF is willing to have [name] return to facility under  hospice benefit.  Plan of Care -DNR/DNI -No artificial feeding now or in the future -IV antibiotic use -Avoid rehospitalization -Symptom management to enhance comfort and quality -requests hospice benefit--prognosis is likely weeks to months   Discussed with HPOA the importance of continued conversation with the  medical providers regarding overall plan of care and treatment options,  ensuring decisions are within the context of the patients values and GOCs.  Questions and concerns addressed discussed with Judson Roch Boswell/clinical social worker  Total time spent on the unit was 35 minutes  Greater than 50% of the time was spent in counseling and coordination of care  Wadie Lessen NP  Palliative Medicine Team Team Phone # [phone] Pager [phone]

## 2019-10-23 NOTE — TOC Progression Note (Addendum)
Transition of Care [facility]) - Progression Note    Patient Details  Name: Marc Perez MRN: [id number] Date of Birth: 19-Sep-1930  Transition of Care [facility]) CM/SW [place], LCSW Phone Number: 10/23/2019, 2:22 PM  Clinical Narrative: Per RN, patient did well with the leg strength assessment and is definitely not bedridden. She left a Advertising account executive for [employer] at [facility]. CSW did as well. Waiting on call back.   4:00 pm: Received voicemail from patient's HCPOA. CSW called back and left him a voicemail. No return call from [place] yet.  Expected Discharge Plan: Assisted Living Barriers to Discharge: Continued Medical Work up  Expected Discharge Plan and Services Expected Discharge Plan: Assisted Living       Living arrangements for the past 2 months: Assisted Living Facility                                       Social Determinants of Health (SDOH) Interventions    Readmission Risk Interventions No flowsheet data found.

## 2019-10-23 NOTE — Progress Notes (Signed)
PROGRESS NOTE    Marc Perez  ZOX:[id number] DOB: April 26, 1930 DOA: 10/16/2019 PCP: Patient, No Pcp Per      Brief Narrative:  Marc Perez is a [age] y.o. M with advanced dementia, SNF-dwelling, HTN and Covid infection 1 month ago with persistent encephalopathy who presented with confusion for several days.  In the ER, found to have creatinine 4.4, sodium 164, and consolidation in the right lung base on chest x-ray.  Started on hypotonic fluids and Levaquin in the hospital service were asked to evaluate for admission.     Assessment & Plan:  Hypernatremia Dehydration Due to reduced oral intake due to post COVID-19 syndrome, inadequate access to free water.  Sodium greater than 160 at admission, started on hypotonic fluids, sodium gradually corrected to normal.   Acute renal failure Likely an ischemic injury in the setting of severe dehydration.  Patient's creatinine was greater than 4 at admission.  He was given IV fluids, and his creatinine gradually resolved to normal.  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  Did not require hospitalization due to hypoxic respiratory failure or pneumonia, but appears to have decreased functional status and oral intake since then.  Here, palliative care were consulted.   His natural trajectory and more recent decline due to COVID-19.  POA made clear the patient's wishes for DNR, no artificial feeding now or in the future, avoiding rehospitalization, symptom management to enhance comfort and quality, and enrollment in hospice with a prognosis that appears likely weeks to months. -Hold donepezil, BuSpar -Full comfort care  Outpatient recommendations: -Enroll in hospice -Avoid rehospitalization -Symptom management to enhance comfort and quality -No artificial feeding or  fluids -DNR/DNI  Hypertension -Hold amlodipine, aspirin       MDM and disposition: The below labs and imaging reports were reviewed and summarized above.  Medication management as above.  The patient was admitted with hyponatremia and acute renal failure.  These were corrected with intravenous fluids, but the patient has a persistent failure to thrive, goals of care were discussed with palliative care and the POA, and decision was made to alert to enroll in hospice.  We now await plans for safe discharge with hospice.   Barriers to discharge: Approval to go back to SNF       DVT prophylaxis: not Applicable, comfort cares Code Status: DNR Family Communication:     Consultants:   Palliative care  Procedures:     Antimicrobials:       Subjective: No complaints.  Patient is confused.  Nursing report no vomiting, fever, respiratory distress, diarrhea.  Objective: Vitals:   10/22/19 2054 10/23/19 0445 10/23/19 0737 10/23/19 1431  BP: (!) 155/87 (!) 158/71 (!) 148/52 (!) 146/58  Pulse: 64 68 64 65  Resp: 20 18 18 18   Temp: 98 F (36.7 C) 97.9 F (36.6 C) (!) 97.5 F (36.4 C) 97.6 F (36.4 C)  TempSrc: Oral Oral Oral Oral  SpO2: 98% 96% 99% 98%  Weight:      Height:        Intake/Output Summary (Last 24 hours) at 10/23/2019 1522 Last data filed at 10/23/2019 1200 Gross per 24 hour  Intake 165 ml  Output 0 ml  Net 165 ml   Filed Weights   10/19/19 0357 10/20/19 0451 10/21/19 0553  Weight: 75.3 kg 75.3 kg 75.3 kg  Examination: General appearance: Cachectic, elderly adult male, alert and in no acute distress.  Screaming out intermittently, pleasant and interactive with me. HEENT: Anicteric, conjunctiva pink, lids and lashes normal. No nasal deformity, discharge, epistaxis.  Lips moist, edentulous, oropharynx dry, no oral lesions.   Skin: Warm and dry.  No jaundice.  No suspicious rashes or lesions. Cardiac: RRR, nl S1-S2, no murmurs appreciated.   Capillary refill is brisk.  JVP normal.  No LE edema.  Radial pulses 2+ and symmetric. Respiratory: Normal respiratory rate and rhythm.  CTAB without rales or wheezes. Abdomen: Abdomen soft.  No TTP or guarding. No ascites, distension, hepatosplenomegaly.   MSK: No deformities or effusions.  Diffuse loss of subcutaneous muscle mass and fat. Neuro: Awake and alert.  EOMI, moves all extremities with generalized weakness. Speech fluent.    Psych: Sensorium intact and responding to questions, attentive to me, makes eye contact, judgment seems severely impaired.       Data Reviewed: I have personally reviewed following labs and imaging studies:  CBC: Recent Labs  Lab 10/17/19 0544 10/18/19 0609 10/19/19 0456  WBC 15.3* 9.1 10.2  HGB 15.9 15.3 15.3  HCT 49.2 46.8 46.0  MCV 90.4 89.3 88.8  PLT 194 165 195   Basic Metabolic Panel: Recent Labs  Lab 10/16/19 2328 10/17/19 1555 10/18/19 0609 10/19/19 0456 10/20/19 0432 10/21/19 0833  NA 157* 154* 151* 150* 145 142  K 3.6 3.4* 3.5 3.9 3.8 3.8  CL 122* 120* 116* 116* 111 112*  CO2 20* 22 24 26 24  21*  GLUCOSE 122* 140* 119* 117* 99 102*  BUN 113* 91* 66* 42* 29* 23  CREATININE 3.21* 2.02* 1.41* 1.41* 1.22 1.17  CALCIUM 8.6* 8.4* 8.5* 8.5* 8.4* 8.4*  MG 3.5*  --  3.1*  --  2.6* 2.4   GFR: Estimated Creatinine Clearance: 45.9 mL/min (by C-G formula based on SCr of 1.17 mg/dL). Liver Function Tests: No results for input(s): AST, ALT, ALKPHOS, BILITOT, PROT, ALBUMIN in the last 168 hours. No results for input(s): LIPASE, AMYLASE in the last 168 hours. No results for input(s): AMMONIA in the last 168 hours. Coagulation Profile: No results for input(s): INR, PROTIME in the last 168 hours. Cardiac Enzymes: No results for input(s): CKTOTAL, CKMB, CKMBINDEX, TROPONINI in the last 168 hours. BNP (last 3 results) No results for input(s): PROBNP in the last 8760 hours. HbA1C: No results for input(s): HGBA1C in the last 72  hours. CBG: Recent Labs  Lab 10/18/19 2021  GLUCAP 100*   Lipid Profile: No results for input(s): CHOL, HDL, LDLCALC, TRIG, CHOLHDL, LDLDIRECT in the last 72 hours. Thyroid Function Tests: No results for input(s): TSH, T4TOTAL, FREET4, T3FREE, THYROIDAB in the last 72 hours. Anemia Panel: No results for input(s): VITAMINB12, FOLATE, FERRITIN, TIBC, IRON, RETICCTPCT in the last 72 hours. Urine analysis:    Component Value Date/Time   COLORURINE AMBER (A) 10/16/2019 1228   APPEARANCEUR CLOUDY (A) 10/16/2019 1228   LABSPEC 1.021 10/16/2019 1228   PHURINE 5.0 10/16/2019 1228   GLUCOSEU NEGATIVE 10/16/2019 1228   HGBUR NEGATIVE 10/16/2019 1228   BILIRUBINUR NEGATIVE 10/16/2019 1228   KETONESUR NEGATIVE 10/16/2019 1228   PROTEINUR 30 (A) 10/16/2019 1228   NITRITE NEGATIVE 10/16/2019 1228   LEUKOCYTESUR NEGATIVE 10/16/2019 1228   Sepsis Labs: @LABRCNTIP (procalcitonin:4,lacticacidven:4)  ) Recent Results (from the past 240 hour(s))  Blood culture (routine x 2)     Status: None   Collection Time: 10/16/19 12:25 PM   Specimen: BLOOD  Result Value  Ref Range Status   Specimen Description BLOOD RIGHT ANTECUBITAL  Final   Special Requests   Final    BOTTLES DRAWN AEROBIC AND ANAEROBIC Blood Culture adequate volume   Culture   Final    NO GROWTH 5 DAYS Performed at [facility], [address] [place], [place] [address]    Report Status 10/21/2019 FINAL  Final  SARS CORONAVIRUS 2 (TAT 6-24 HRS) Nasopharyngeal Nasopharyngeal Swab     Status: Abnormal   Collection Time: 10/16/19  4:43 PM   Specimen: Nasopharyngeal Swab  Result Value Ref Range Status   SARS Coronavirus 2 POSITIVE (A) NEGATIVE Final    Comment: RESULT CALLED TO, READ BACK BY AND VERIFIED WITH: J.PAGE,RN 4008 10/17/19 G.MCADOO (NOTE) SARS-CoV-2 target nucleic acids are DETECTED. The SARS-CoV-2 RNA is generally detectable in upper and lower respiratory specimens during the acute phase of infection.  Positive results are indicative of the presence of SARS-CoV-2 RNA. Clinical correlation with patient history and other diagnostic information is  necessary to determine patient infection status. Positive results do not rule out bacterial infection or co-infection with other viruses.  The expected result is Negative. Fact Sheet for Patients: HairSlick.no Fact Sheet for Healthcare Providers: quierodirigir.com This test is not yet approved or cleared by the [place] FDA and  has been authorized for detection and/or diagnosis of SARS-CoV-2 by FDA under an Emergency Use Authorization (EUA). This EUA will remain  in effect (meaning this test can be used) for the du ration of the COVID-19 declaration under Section 564(b)(1) of the Act, 21 U.S.C. section 360bbb-3(b)(1), unless the authorization is terminated or revoked sooner. Performed at [facility], [address] [place], [place] [address]   Blood culture (routine x 2)     Status: None   Collection Time: 10/16/19  5:41 PM   Specimen: BLOOD  Result Value Ref Range Status   Specimen Description BLOOD BLOOD RIGHT HAND  Final   Special Requests   Final    BOTTLES DRAWN AEROBIC AND ANAEROBIC Blood Culture results may not be optimal due to an inadequate volume of blood received in culture bottles   Culture   Final    NO GROWTH 5 DAYS Performed at [facility], [address] [place], [place] [address]    Report Status 10/21/2019 FINAL  Final  MRSA PCR Screening     Status: None   Collection Time: 10/16/19  8:36 PM   Specimen: Nasopharyngeal  Result Value Ref Range Status   MRSA by PCR NEGATIVE NEGATIVE Final    Comment:        The GeneXpert MRSA Assay (FDA approved for NASAL specimens only), is one component of a comprehensive MRSA colonization surveillance program. It is not intended to diagnose MRSA infection nor to guide or monitor treatment for MRSA  infections. Performed at [facility], [address], [place], [place] [address]          Radiology Studies: No results found.      Scheduled Meds: . mouth rinse  15 mL Mouth Rinse BID  . sodium chloride flush  3 mL Intravenous Q12H   Continuous Infusions:   LOS: 7 days    Time spent: 25 minutes    Alberteen Sam, MD [facility] 10/23/2019, 3:22 PM     Please page though AMION or Epic secure chat:  For [employer], Higher education careers adviser

## 2019-10-24 NOTE — Progress Notes (Signed)
New referral for [name] hospice services at [facility] received from [name]. Patient was COVID Positive at [facility] aprox 20 days ago and he  tested positive again at [facility] on 12/23. He is currently on airborne precautions with plans for discontinuation on 1/1 per CSW [employer]. Patient information faxed to referral. Hospital bed has been requested. Writer spoke with patient's Marc Perez to initiate education regarding hospice services, philosophy and team approach to care with understanding voiced. Sonia Side confirmed that patient has a "doctor that sees him at [facility] is for discharge tomorrow, patient will require EMS transport with signed out of facility DNR in place. Please contact [facility] hospice at [phone] when patient discharge. Please fax discharge summary to [phone]. Thank you.  Flo Shanks BSN, RN, [facility] Liaison [phone]

## 2019-10-24 NOTE — Care Management Important Message (Signed)
Important Message  Patient Details  Name: Marc Perez MRN: [id number] Date of Birth: Feb 09, 1930   Medicare Important Message Given:  Yes  Verbally reviewed Medicare IM with POA, [name], at [phone].  Copy already mailed to his home address earlier in week.  Dannette Barbara 10/24/2019, 12:19 PM

## 2019-10-24 NOTE — TOC Progression Note (Addendum)
Transition of Care [facility]) - Progression Note    Patient Details  Name: Marc Perez MRN: [id number] Date of Birth: 1930-04-21  Transition of Care [facility]) CM/SW Contact  [name], [place] Phone Number: 10/24/2019, 9:56 AM  Clinical Narrative:    9:30am CSW tried to call Marc Perez at their main number, phone rang 4x and then no answer, and disconnected.  9:50am CSW attempted to call Marc Perez at [facility], left a message awaiting for call back. 1:00pm CSW tried calling facility to speak to someone no answer and unable to leave a message.  1:20pm  CSW attempted to call Marc Perez at [facility] again today, left a message awaiting for call back.  4:30pm  CSW received phone call from Marc Perez, Marc Perez at [facility] [employer] [phone], Marc Perez informed him that CSW has not received a phone call back from Marc Perez, even though two messages have been left for her, and CSW was not able to speak to anyone at the facility, after trying 3 times to get a hold of someone.  CSW informed Marc of what the nurse is stating in regards to patient being able to turn himself, and that he has good muscle strength.  Per bedside nurse, patient is choosing not to get up, CSW was informed that HCPOA has decided to have patient return back to [facility] with hospice services.  CSW contacted Marc Perez at [facility] to make the referral, which is the agency patient's HCPOA had decided on.  CSW discussed with Marc what equipment they will need, and he said a hospital bed would help patient due to some of his weakness.  CSW updated Marc Perez on what equipment he will need.  Per Marc he said to have case manager call him tomorrow to discuss discharge planning with hospice services back to ALF.  He will then discuss with Marc Perez about patient's needs.  CSW updated covering case managers of situation.  Patient will need a new FL2 and a discharge summary in order for patient to discharge back to [facility].  Patient's HCPOA Marc Perez [phone] would like  to be notified tomorrow about the discharge plan back to ALF.    Expected Discharge Plan: Assisted Living Barriers to Discharge: Continued Medical Work up  Expected Discharge Plan and Services Expected Discharge Plan: Assisted Living       Living arrangements for the past 2 months: Assisted Living Facility                                       Social Determinants of Health (SDOH) Interventions    Readmission Risk Interventions No flowsheet data found.

## 2019-10-24 NOTE — Progress Notes (Signed)
PROGRESS NOTE    Marc Perez  TIW:[id number] DOB: [birth year] DOA: 10/16/2019 PCP: Patient, No Pcp Per      Brief Narrative:  Marc Perez is a 85 y.o. M with advanced dementia, SNF-dwelling, HTN and Covid infection 1 month ago with persistent encephalopathy who presented with confusion for several days.  In the ER, found to have creatinine 4.4, sodium 164, and consolidation in the right lung base on chest x-ray.  Started on hypotonic fluids and Levaquin in the hospital service were asked to evaluate for admission.     Assessment & Plan:  Hypernatremia Dehydration Due to reduced oral intake due to post COVID-19 syndrome, inadequate access to free water.  Sodium greater than 160 at admission, started on hypotonic fluids, sodium gradually corrected to normal.   Acute renal failure Likely an ischemic injury in the setting of severe dehydration.  Patient's creatinine was greater than 4 at admission.  He was given IV fluids, and his creatinine gradually resolved to normal.  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  Did not require hospitalization due to hypoxic respiratory failure or pneumonia, but appears to have decreased functional status and oral intake since then.  Here, palliative care were consulted.   His natural trajectory and more recent decline due to COVID-19.  POA made clear the patient's wishes for DNR, no artificial feeding now or in the future, avoiding rehospitalization, symptom management to enhance comfort and quality, and enrollment in hospice with a prognosis that appears likely weeks to months. Patient appears more dehydrated today, more listless. -Hold donepezil, BuSpar -Full comfort care  Outpatient recommendations: -Enroll in hospice -Avoid rehospitalization -Symptom management to  enhance comfort and quality -No artificial feeding or fluids -DNR/DNI  Hypertension -Hold amlodipine, aspirin       MDM and disposition: The patient was admitted with hyponatremia and acute renal failure.  These were corrected with intravenous fluids, but the patient has a persistent failure to thrive, goals of care were discussed with palliative care and the POA, and decision was made to alert to enroll in hospice.    At present, the patient is on comfort measures.  We have no safe discharge plan, and are working with his previous living facility for arrangements to discharge home.   Barriers to discharge: Approval to go back to facility       DVT prophylaxis: not Applicable, comfort cares Code Status: DNR Family Communication:     Consultants:   Palliative care  Procedures:     Antimicrobials:       Subjective: Patient has no complaints.  He is not hungry.  Nursing report no fever, vomiting, respiratory distress.  Objective: Vitals:   10/23/19 2041 10/24/19 0402 10/24/19 0715 10/24/19 1439  BP: (!) 158/64 138/74 133/70 (!) 149/52  Pulse: 64 75 73 (!) 48  Resp: 17 20 18 18   Temp: 98.4 F (36.9 C) 97.8 F (36.6 C) 97.9 F (36.6 C) 98.5 F (36.9 C)  TempSrc: Oral Oral  Oral  SpO2: 99% 97% 99% 95%  Weight:      Height:        Intake/Output Summary (Last 24 hours) at 10/24/2019 1630 Last data filed at 10/24/2019 0410 Gross per 24 hour  Intake -  Output 100 ml  Net -100 ml   Filed Weights   10/19/19 0357 10/20/19 0451 10/21/19 0553  Weight: 75.3 kg 75.3 kg 75.3 kg    Examination: General appearance: Thin, elderly adult male, alert and in no acute distress.  He is disheveled. HEENT: Anicteric, conjunctiva pink, lids and lashes normal. No nasal deformity, discharge, epistaxis.  Lips dry, oropharynx tacky dry, crusted secretions around the lips, edentulous.  Hearing diminished. Skin: Warm and dry.  No suspicious rashes or lesions.  Senile  purpura. Cardiac: RRR, no murmurs appreciated.  No LE edema.    Respiratory: Normal respiratory rate and rhythm.  CTAB without rales or wheezes. Abdomen: Abdomen soft.  No grimace to palpation. No ascites, distension, hepatosplenomegaly.   MSK: No deformities or effusions of the large joints of the upper or lower extremities bilaterally.  Diffuse loss of subcutaneous muscle mass and fat. Neuro: Awake and makes eye contact, mumbles incoherently, moves upper extremities with generalized weakness. Psych: Eyes open, but confused, very hard of hearing, oriented only to self, listless.       Data Reviewed: I have personally reviewed following labs and imaging studies:  CBC: Recent Labs  Lab 10/18/19 0609 10/19/19 0456  WBC 9.1 10.2  HGB 15.3 15.3  HCT 46.8 46.0  MCV 89.3 88.8  PLT 165 536   Basic Metabolic Panel: Recent Labs  Lab 10/18/19 0609 10/19/19 0456 10/20/19 0432 10/21/19 0833  NA 151* 150* 145 142  K 3.5 3.9 3.8 3.8  CL 116* 116* 111 112*  CO2 24 26 24  21*  GLUCOSE 119* 117* 99 102*  BUN 66* 42* 29* 23  CREATININE 1.41* 1.41* 1.22 1.17  CALCIUM 8.5* 8.5* 8.4* 8.4*  MG 3.1*  --  2.6* 2.4   GFR: Estimated Creatinine Clearance: 45.9 mL/min (by C-G formula based on SCr of 1.17 mg/dL). Liver Function Tests: No results for input(s): AST, ALT, ALKPHOS, BILITOT, PROT, ALBUMIN in the last 168 hours. No results for input(s): LIPASE, AMYLASE in the last 168 hours. No results for input(s): AMMONIA in the last 168 hours. Coagulation Profile: No results for input(s): INR, PROTIME in the last 168 hours. Cardiac Enzymes: No results for input(s): CKTOTAL, CKMB, CKMBINDEX, TROPONINI in the last 168 hours. BNP (last 3 results) No results for input(s): PROBNP in the last 8760 hours. HbA1C: No results for input(s): HGBA1C in the last 72 hours. CBG: Recent Labs  Lab 10/18/19 2021  GLUCAP 100*   Lipid Profile: No results for input(s): CHOL, HDL, LDLCALC, TRIG, CHOLHDL,  LDLDIRECT in the last 72 hours. Thyroid Function Tests: No results for input(s): TSH, T4TOTAL, FREET4, T3FREE, THYROIDAB in the last 72 hours. Anemia Panel: No results for input(s): VITAMINB12, FOLATE, FERRITIN, TIBC, IRON, RETICCTPCT in the last 72 hours. Urine analysis:    Component Value Date/Time   COLORURINE AMBER (A) 10/16/2019 1228   APPEARANCEUR CLOUDY (A) 10/16/2019 1228   LABSPEC 1.021 10/16/2019 1228   PHURINE 5.0 10/16/2019 1228   GLUCOSEU NEGATIVE 10/16/2019 1228   HGBUR NEGATIVE 10/16/2019 1228   BILIRUBINUR NEGATIVE 10/16/2019 1228   KETONESUR NEGATIVE 10/16/2019 1228   PROTEINUR 30 (A) 10/16/2019 1228   NITRITE NEGATIVE 10/16/2019 1228   LEUKOCYTESUR NEGATIVE 10/16/2019 1228   Sepsis Labs: @LABRCNTIP (procalcitonin:4,lacticacidven:4)  ) Recent Results (from the past 240 hour(s))  Blood culture (routine x 2)     Status: None   Collection Time: 10/16/19 12:25 PM   Specimen: BLOOD  Result Value Ref Range Status   Specimen Description BLOOD RIGHT ANTECUBITAL  Final   Special Requests   Final    BOTTLES DRAWN AEROBIC AND ANAEROBIC Blood Culture adequate  volume   Culture   Final    NO GROWTH 5 DAYS Performed at [facility], [address] [place], [place] [address]    Report Status 10/21/2019 FINAL  Final  SARS CORONAVIRUS 2 (TAT 6-24 HRS) Nasopharyngeal Nasopharyngeal Swab     Status: Abnormal   Collection Time: 10/16/19  4:43 PM   Specimen: Nasopharyngeal Swab  Result Value Ref Range Status   SARS Coronavirus 2 POSITIVE (A) NEGATIVE Final    Comment: RESULT CALLED TO, READ BACK BY AND VERIFIED WITH: J.PAGE,RN [id number] 10/17/19 G.MCADOO (NOTE) SARS-CoV-2 target nucleic acids are DETECTED. The SARS-CoV-2 RNA is generally detectable in upper and lower respiratory specimens during the acute phase of infection. Positive results are indicative of the presence of SARS-CoV-2 RNA. Clinical correlation with patient history and other diagnostic information is   necessary to determine patient infection status. Positive results do not rule out bacterial infection or co-infection with other viruses.  The expected result is Negative. Fact Sheet for Patients: HairSlick.nohttps://www.fda.gov/media/138098/download Fact Sheet for Healthcare Providers: quierodirigir.comhttps://www.fda.gov/media/138095/download This test is not yet approved or cleared by the [place] FDA and  has been authorized for detection and/or diagnosis of SARS-CoV-2 by FDA under an Emergency Use Authorization (EUA). This EUA will remain  in effect (meaning this test can be used) for the du ration of the COVID-19 declaration under Section 564(b)(1) of the Act, 21 U.S.C. section 360bbb-3(b)(1), unless the authorization is terminated or revoked sooner. Performed at [facility], [address] [place], [place] [address]   Blood culture (routine x 2)     Status: None   Collection Time: 10/16/19  5:41 PM   Specimen: BLOOD  Result Value Ref Range Status   Specimen Description BLOOD BLOOD RIGHT HAND  Final   Special Requests   Final    BOTTLES DRAWN AEROBIC AND ANAEROBIC Blood Culture results may not be optimal due to an inadequate volume of blood received in culture bottles   Culture   Final    NO GROWTH 5 DAYS Performed at [facility], [address] [place], [place] [address]    Report Status 10/21/2019 FINAL  Final  MRSA PCR Screening     Status: None   Collection Time: 10/16/19  8:36 PM   Specimen: Nasopharyngeal  Result Value Ref Range Status   MRSA by PCR NEGATIVE NEGATIVE Final    Comment:        The GeneXpert MRSA Assay (FDA approved for NASAL specimens only), is one component of a comprehensive MRSA colonization surveillance program. It is not intended to diagnose MRSA infection nor to guide or monitor treatment for MRSA infections. Performed at [facility], [address] [place], [place] [address]          Radiology Studies: No results found.       Scheduled Meds: . mouth rinse  15 mL Mouth Rinse BID  . sodium chloride flush  3 mL Intravenous Q12H   Continuous Infusions:   LOS: 8 days    Time spent: 25 minutes    Alberteen Samhristopher P Conway Fedora, MD [facility] 10/24/2019, 4:30 PM     Please page though AMION or Epic secure chat:  For [employer] password, Higher education careers advisercontact charge nurse

## 2019-10-25 NOTE — TOC Progression Note (Signed)
Transition of Care [facility]) - Progression Note    Patient Details  Name: Marc Perez MRN: [id number] Date of Birth: 10/07/1930  [facility]) CM/SW Contact  Shawn Route, RN Phone Number: 10/25/2019, 3:54 PM  Clinical Narrative:       Expected Discharge Plan: Assisted Living Barriers to Discharge: Continued Medical Work up  Expected Discharge Plan and Services Expected Discharge Plan: Assisted Living       Living arrangements for the past 2 months: Assisted Living Facility                                       Social Determinants of Health (SDOH) Interventions    Readmission Risk Interventions No flowsheet data found.

## 2019-10-25 NOTE — Progress Notes (Signed)
PROGRESS NOTE    Marc Perez  WGY:[id number] DOB: April 25, 1930 DOA: 10/16/2019 PCP: Patient, No Pcp Per      Brief Narrative:  Mr. Cahalan is a 86 y.o. M with advanced dementia, SNF-dwelling, HTN and Covid infection 1 month ago with persistent encephalopathy who presented with confusion for several days.  In the ER, found to have creatinine 4.4, sodium 164, and consolidation in the right lung base on chest x-ray.  Started on hypotonic fluids and Levaquin in the hospital service were asked to evaluate for admission.     Assessment & Plan:  Hypernatremia Dehydration Due to reduced oral intake due to post COVID-19 syndrome, inadequate access to free water.  Sodium greater than 160 at admission, started on hypotonic fluids, sodium gradually corrected to normal.   Acute renal failure Likely an ischemic injury in the setting of severe dehydration.  Patient's creatinine was greater than 4 at admission.  He was given IV fluids, and his creatinine gradually resolved to normal.  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  Did not require hospitalization due to hypoxic respiratory failure or pneumonia, but appears to have decreased functional status and oral intake since then.  Here, palliative care were consulted.   His natural trajectory and more recent decline due to COVID-19.  POA made clear the patient's wishes for DNR, no artificial feeding now or in the future, avoiding rehospitalization, symptom management to enhance comfort and quality, and enrollment in hospice with a prognosis that appears likely weeks to months. Patient appears more dehydrated today, more listless. -Hold donepezil, BuSpar -Full comfort care  Outpatient recommendations: -Enroll in hospice -Avoid rehospitalization -Symptom management to  enhance comfort and quality -No artificial feeding or fluids -DNR/DNI  Hypertension -Hold amlodipine, aspirin       MDM and disposition: The patient was admitted with hyponatremia and acute renal failure.  These were corrected with intravenous fluids, but the patient has a persistent failure to thrive, goals of care were discussed with palliative care and the POA, and decision was made to alert to enroll in hospice.    At present, the patient is on comfort measures.  Presently has no safe discharge plan, and are working with his previous living facility for arrangements to discharge there.  Barriers to discharge: Approval to go back to facility       DVT prophylaxis: not Applicable, comfort cares Code Status: DNR Family Communication:     Consultants:   Palliative care  Procedures:     Antimicrobials:       Subjective: He has no complaints.  Nursing report no new concerns.  Objective: Vitals:   10/24/19 0715 10/24/19 1439 10/24/19 1944 10/25/19 0740  BP: 133/70 (!) 149/52 (!) 148/80 135/60  Pulse: 73 (!) 48 91 75  Resp: 18 18 20  (!) 21  Temp: 97.9 F (36.6 C) 98.5 F (36.9 C) 97.7 F (36.5 C) (!) 97.4 F (36.3 C)  TempSrc:  Oral Oral Oral  SpO2: 99% 95% 96% 98%  Weight:      Height:       No intake or output data in the 24 hours ending 10/25/19 1352 Filed Weights   10/19/19 0357 10/20/19 0451 10/21/19 0553  Weight: 75.3 kg 75.3 kg 75.3 kg    Examination: General appearance: Elderly adult male, appears cachectic, disheveled  Data Reviewed: I have personally reviewed following labs and imaging studies:  CBC: Recent Labs  Lab 10/19/19 0456  WBC 10.2  HGB 15.3  HCT 46.0  MCV 88.8  PLT 132   Basic Metabolic Panel: Recent Labs  Lab 10/19/19 0456 10/20/19 0432 10/21/19 0833  NA 150* 145 142  K 3.9 3.8 3.8  CL 116* 111 112*  CO2 26 24 21*  GLUCOSE 117* 99 102*  BUN 42* 29* 23  CREATININE 1.41* 1.22 1.17  CALCIUM 8.5*  8.4* 8.4*  MG  --  2.6* 2.4   GFR: Estimated Creatinine Clearance: 45.9 mL/min (by C-G formula based on SCr of 1.17 mg/dL). Liver Function Tests: No results for input(s): AST, ALT, ALKPHOS, BILITOT, PROT, ALBUMIN in the last 168 hours. No results for input(s): LIPASE, AMYLASE in the last 168 hours. No results for input(s): AMMONIA in the last 168 hours. Coagulation Profile: No results for input(s): INR, PROTIME in the last 168 hours. Cardiac Enzymes: No results for input(s): CKTOTAL, CKMB, CKMBINDEX, TROPONINI in the last 168 hours. BNP (last 3 results) No results for input(s): PROBNP in the last 8760 hours. HbA1C: No results for input(s): HGBA1C in the last 72 hours. CBG: Recent Labs  Lab 10/18/19 2021  GLUCAP 100*   Lipid Profile: No results for input(s): CHOL, HDL, LDLCALC, TRIG, CHOLHDL, LDLDIRECT in the last 72 hours. Thyroid Function Tests: No results for input(s): TSH, T4TOTAL, FREET4, T3FREE, THYROIDAB in the last 72 hours. Anemia Panel: No results for input(s): VITAMINB12, FOLATE, FERRITIN, TIBC, IRON, RETICCTPCT in the last 72 hours. Urine analysis:    Component Value Date/Time   COLORURINE AMBER (A) 10/16/2019 1228   APPEARANCEUR CLOUDY (A) 10/16/2019 1228   LABSPEC 1.021 10/16/2019 1228   PHURINE 5.0 10/16/2019 1228   GLUCOSEU NEGATIVE 10/16/2019 1228   HGBUR NEGATIVE 10/16/2019 1228   BILIRUBINUR NEGATIVE 10/16/2019 [place] 10/16/2019 1228   PROTEINUR 30 (A) 10/16/2019 1228   NITRITE NEGATIVE 10/16/2019 1228   LEUKOCYTESUR NEGATIVE 10/16/2019 1228   Sepsis Labs: @LABRCNTIP (procalcitonin:4,lacticacidven:4)  ) Recent Results (from the past 240 hour(s))  Blood culture (routine x 2)     Status: None   Collection Time: 10/16/19 12:25 PM   Specimen: BLOOD  Result Value Ref Range Status   Specimen Description BLOOD RIGHT ANTECUBITAL  Final   Special Requests   Final    BOTTLES DRAWN AEROBIC AND ANAEROBIC Blood Culture adequate volume    Culture   Final    NO GROWTH 5 DAYS Performed at [facility], [address], [place], [place] [address]    Report Status 10/21/2019 FINAL  Final  SARS CORONAVIRUS 2 (TAT 6-24 HRS) Nasopharyngeal Nasopharyngeal Swab     Status: Abnormal   Collection Time: 10/16/19  4:43 PM   Specimen: Nasopharyngeal Swab  Result Value Ref Range Status   SARS Coronavirus 2 POSITIVE (A) NEGATIVE Final    Comment: RESULT CALLED TO, READ BACK BY AND VERIFIED WITH: J.PAGE,RN 2725 10/17/19 G.MCADOO (NOTE) SARS-CoV-2 target nucleic acids are DETECTED. The SARS-CoV-2 RNA is generally detectable in upper and lower respiratory specimens during the acute phase of infection. Positive results are indicative of the presence of SARS-CoV-2 RNA. Clinical correlation with patient history and other diagnostic information is  necessary to determine patient infection status. Positive results do not rule out bacterial infection or co-infection with other viruses.  The expected result is Negative. Fact Sheet for Patients: SugarRoll.be Fact Sheet for Healthcare Providers: https://www.woods-mathews.com/ This test is not yet  approved or cleared by the [place] and  has been authorized for detection and/or diagnosis of SARS-CoV-2 by FDA under an Emergency Use Authorization (EUA). This EUA will remain  in effect (meaning this test can be used) for the du ration of the COVID-19 declaration under Section 564(b)(1) of the Act, 21 U.S.C. section 360bbb-3(b)(1), unless the authorization is terminated or revoked sooner. Performed at [facility], [address], [place], [place] [address]   Blood culture (routine x 2)     Status: None   Collection Time: 10/16/19  5:41 PM   Specimen: BLOOD  Result Value Ref Range Status   Specimen Description BLOOD BLOOD RIGHT HAND  Final   Special Requests   Final    BOTTLES DRAWN AEROBIC AND ANAEROBIC Blood Culture results may  not be optimal due to an inadequate volume of blood received in culture bottles   Culture   Final    NO GROWTH 5 DAYS Performed at [facility], [address] [place], [place] [address]    Report Status 10/21/2019 FINAL  Final  MRSA PCR Screening     Status: None   Collection Time: 10/16/19  8:36 PM   Specimen: Nasopharyngeal  Result Value Ref Range Status   MRSA by PCR NEGATIVE NEGATIVE Final    Comment:        The GeneXpert MRSA Assay (FDA approved for NASAL specimens only), is one component of a comprehensive MRSA colonization surveillance program. It is not intended to diagnose MRSA infection nor to guide or monitor treatment for MRSA infections. Performed at [facility], [address], [place], [place] [address]          Radiology Studies: No results found.      Scheduled Meds: . mouth rinse  15 mL Mouth Rinse BID   Continuous Infusions:   LOS: 9 days    Time spent: 15 minutes    Alberteen Sam, MD [facility] 10/25/2019, 1:52 PM     Please page though AMION or Epic secure chat:  For [employer], Higher education careers adviser

## 2019-10-25 NOTE — Progress Notes (Signed)
Patient has rested quietly this shift. Remains incontinent and pleasantly confused. No telemetry or IV. Poor PO intake. Planning to d/c when bed available per MD.

## 2019-10-25 NOTE — Progress Notes (Signed)
Right FA saline lock found to be painful and reddened when flushed. Discussed with Cliffton Asters, APP on call and given that pt is comfort care for DC tomorrow with hospice, and receiving no IV medications, will not restart PIV for now.

## 2019-10-26 MED ORDER — SENNOSIDES-DOCUSATE SODIUM 8.6-50 MG PO TABS: 1.0000 | ORAL_TABLET | Freq: Every evening | ORAL | Status: AC | PRN

## 2019-10-26 MED ORDER — ORAL CARE MOUTH RINSE: 15.0000 mL | Freq: Two times a day (BID) | OROMUCOSAL | 0 refills | Status: AC

## 2019-10-26 MED ORDER — ONDANSETRON HCL 4 MG PO TABS: 4.0000 mg | ORAL_TABLET | Freq: Four times a day (QID) | ORAL | 0 refills | Status: AC | PRN

## 2019-10-26 MED ORDER — ACETAMINOPHEN 325 MG PO TABS: 650.0000 mg | ORAL_TABLET | Freq: Four times a day (QID) | ORAL | Status: AC | PRN

## 2019-10-26 MED ORDER — LORAZEPAM 1 MG PO TABS: 1.0000 mg | ORAL_TABLET | Freq: Four times a day (QID) | ORAL | 0 refills | Status: AC | PRN

## 2019-10-26 MED ORDER — MORPHINE SULFATE (CONCENTRATE) 10 MG/0.5ML PO SOLN: 5.0000 mg | ORAL | 0 refills | Status: AC | PRN

## 2019-10-26 NOTE — TOC Progression Note (Signed)
Transition of Care [facility]) - Progression Note    Patient Details  Name: Marc Perez MRN: [id number] Date of Birth: 1930-02-22  Transition of Care [facility]) CM/SW Contact  Darleene Cleaver, [place] Phone Number: 10/26/2019, 10:36 AM  Clinical Narrative:     CSW attempted to speak to Ed administrator, from [facility] had to leave a message on voice mail.  CSW will continue to try to get a hold of administrator.   Expected Discharge Plan: Assisted Living Barriers to Discharge: Continued Medical Work up  Expected Discharge Plan and Services Expected Discharge Plan: Assisted Living       Living arrangements for the past 2 months: Assisted Living Facility                                       Social Determinants of Health (SDOH) Interventions    Readmission Risk Interventions No flowsheet data found.

## 2019-10-26 NOTE — Progress Notes (Signed)
Gave report to medtech from [facility] receiving patient. No questions at this time. Called EMS for transport back to facility.

## 2019-10-26 NOTE — TOC Transition Note (Signed)
Transition of Care [facility]) - CM/SW Discharge Note   Patient Details  Name: Marc Perez MRN: [id number] Date of Birth: 01/05/1930  Transition of Care [facility]) CM/SW Contact:  Marc Cleaver, LCSW Phone Number: 10/26/2019, 7:14 PM   Clinical Narrative:    CSW spoke with Marc Perez from [facility], patient will be discharging back to [facility] with hospice services to follow.  CSW spoke to Marc Perez at [facility] and informed her that patient's PCP is [facility], and [facility] Malta is who sees patient.  Per [facility], they should be able to see patient on Sunday.  Marc Perez requested that [facility] and discharge summary be faxed to [facility] [phone], and also have it emailed to her at [email or web address].  CSW sent requested information.  CSW received phone call from ALF pharmacy and spoke to Marc Perez, [phone], and she requested that controlled meds be faxed to [phone] so patient's meds can be filled.  CSW faxed requested information.  CSW updated patient's Marc Perez and let him know that patient is discharging back to ALF today with hospice services via EMS.   Final next level of care: Assisted Living Barriers to Discharge: Barriers Resolved   Patient Goals and CMS Choice Patient states their goals for this hospitalization and ongoing recovery are:: To return to [facility] with Hospice services CMS Medicare.gov Compare Post Acute Care list provided to:: Patient Represenative (must comment) Choice offered to / list presented to : [facility] POA / Guardian  Discharge Placement                Patient to be transferred to facility by: [facility] EMS Name of family member notified: Marc Perez [phone] Patient and family notified of of transfer: 10/26/19  Discharge Plan and Services                DME Arranged: Hospital bed DME Agency: Other - Comment([facility] hospice services.) Date DME Agency Contacted: 10/26/19 Time DME Agency Contacted:  1200 Representative spoke with at DME Agency: Marc Perez   [facility] Agency: Other - See comment([facility] Hospice services.) Date [facility] Agency Contacted: 10/26/19 Time HH Agency Contacted: 1200 Representative spoke with at [facility] Agency: Marc Perez  Social Determinants of Health (SDOH) Interventions     Readmission Risk Interventions No flowsheet data found.

## 2019-10-26 NOTE — Discharge Summary (Signed)
Physician Discharge Summary  Santi Troung EXB:[id number] DOB: September 13, 1930 DOA: 10/16/2019  PCP: Patient, No Pcp Per  Admit date: 10/16/2019 Discharge date: 10/26/2019  Admitted From: ALF  Disposition:  ALF with Hospice   Recommendations for Outpatient Follow-up:  1. For any questions or deterioration in patient condition, contact [facility] hospice at [phone] first      [place]: N/A  Equipment/Devices: None  Discharge Condition: Comfort measures  CODE STATUS: DO NOT RESUSCITATE Diet recommendation: Comfort feeds, liberal diet  Brief/Interim Summary: Mr. Vore is a 86 y.o. M with advanced dementia, SNF-dwelling, HTN and Covid infection 1 month ago with persistent encephalopathy who presented with confusion for several days.  In the ER, found to have creatinine 4.4, sodium 164, and consolidation in the right lung base on chest x-ray.  Started on hypotonic fluids and Levaquin in the hospital service were asked to evaluate for admission.            PRINCIPAL HOSPITAL DIAGNOSIS: Dehydration due to severe post-COVID encephalopathy and failure to thrive    Discharge Diagnoses:   Hypernatremia Dehydration Patient found to have sodium >160 at admission due to severe post COVID-19 syndrome leading to reduced oral intake in the setting of inadequate access to free water.    Started on hypotonic fluids, sodium gradually corrected to normal, without improvement in mentation.   Acute renal failure Likely an ischemic injury in the setting of severe dehydration.  Patient's creatinine was greater than 4 at admission.  He was given IV fluids, and his creatinine gradually resolved to normal.  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  Did not require hospitalization due to hypoxic respiratory  failure or pneumonia, but appears to have decreased functional status and oral intake since then.  Here, palliative care were consulted.   His natural trajectory and more recent decline due to COVID-19 were discussed with family/POA.  POA made clear the patient's wishes for DNR, no artificial feeding now or in the future, avoiding rehospitalization, symptom management to enhance comfort and quality, and enrollment in hospice with a prognosis that appears likely weeks to months.  Hypertension -Hold amlodipine, aspirin            Discharge Instructions -Avoid rehospitalization -Symptom management to enhance comfort and quality -No artificial feeding or fluids -DNR/DNI       Allergies as of 10/26/2019   No Known Allergies     Medication List    STOP taking these medications   amLODipine 5 MG tablet Commonly known as: NORVASC   aspirin 81 MG EC tablet   busPIRone 5 MG tablet Commonly known as: BUSPAR   Calcium 600-D 600-400 MG-UNIT Tabs Generic drug: Calcium Carbonate-Vitamin D3   donepezil 10 MG tablet Commonly known as: ARICEPT   Fish Oil 1000 MG Caps   Ginkgo Biloba Memory Enhancer 60 MG Caps Generic drug: Ginkgo Biloba Extract   Lutein 20 MG Caps   Red Yeast Rice Extract 600 MG Caps   vitamin E 400 UNIT capsule     TAKE these medications   acetaminophen 325 MG tablet Commonly known as: TYLENOL Take 2 tablets (650 mg total) by mouth every 6 (six) hours as needed for mild pain (or Fever >/= 101). What changed:   when to take this  reasons to take this   LORazepam 1 MG tablet Commonly known as: ATIVAN  Take 1 tablet (1 mg total) by mouth every 6 (six) hours as needed for anxiety.   morphine CONCENTRATE 10 MG/0.5ML Soln concentrated solution Take 0.25 mLs (5 mg total) by mouth every 2 (two) hours as needed for moderate pain, severe pain or shortness of breath.   mouth rinse Liqd solution 15 mLs by Mouth Rinse route 2 (two) times daily.    ondansetron 4 MG tablet Commonly known as: ZOFRAN Take 1 tablet (4 mg total) by mouth every 6 (six) hours as needed for nausea.   senna-docusate 8.6-50 MG tablet Commonly known as: Senokot-S Take 1 tablet by mouth at bedtime as needed for mild constipation.       No Known Allergies  Consultations:  Palliative Care  Hospice   Procedures/Studies: DG Chest 1 View  Result Date: 10/16/2019 CLINICAL DATA:  Weakness EXAM: CHEST  1 VIEW COMPARISON:  None. FINDINGS: The heart size and mediastinal contours are within normal limits. Minimal patchy density at the right lung base. The visualized skeletal structures are unremarkable. IMPRESSION: Minimal patchy atelectasis/consolidation at the right lung base. Electronically Signed   By: Guadlupe Spanish M.D.   On: 10/16/2019 13:22   CT Head Wo Contrast  Result Date: 10/16/2019 CLINICAL DATA:  Progressive altered mental status. History of dementia. COVID-19. EXAM: CT HEAD WITHOUT CONTRAST TECHNIQUE: Contiguous axial images were obtained from the base of the skull through the vertex without intravenous contrast. COMPARISON:  None. FINDINGS: Brain: No evidence of acute infarction, hemorrhage, hydrocephalus, extra-axial collection or mass lesion/mass effect. Diffuse moderate atrophy with secondary ventricular dilatation. Chronic periventricular white matter lucency particularly in the frontal lobes, likely small vessel ischemic disease. Vascular: No hyperdense vessel or unexpected calcification. Skull: Normal. Negative for fracture or focal lesion. Sinuses/Orbits: Normal. Other: None IMPRESSION: No acute abnormality. Atrophy with chronic small vessel ischemic disease. Electronically Signed   By: Francene Boyers M.D.   On: 10/16/2019 13:00   DG Chest Port 1 View  Result Date: 10/17/2019 CLINICAL DATA:  COVID positive.  Pneumonia. EXAM: PORTABLE CHEST 1 VIEW COMPARISON:  10/16/2019 FINDINGS: Heart and mediastinal contours are within normal limits. No  focal opacities or effusions. No acute bony abnormality. IMPRESSION: No active disease. Electronically Signed   By: Charlett Nose M.D.   On: 10/17/2019 10:09       Subjective: No new pain complaints, respiratory distress, vomiting, diarrhea, agitation.  Patient is essentially nonverbal.  Discharge Exam: Vitals:   10/26/19 0337 10/26/19 0804  BP: 100/74 (!) 169/74  Pulse: 65 (!) 59  Resp:  18  Temp:  97.6 F (36.4 C)  SpO2:  98%   Vitals:   10/25/19 2030 10/26/19 0335 10/26/19 0337 10/26/19 0804  BP: (!) 150/89 (!) 132/101 100/74 (!) 169/74  Pulse: 73 69 65 (!) 59  Resp:    18  Temp: (!) 97.5 F (36.4 C) 98.7 F (37.1 C)  97.6 F (36.4 C)  TempSrc: Oral Oral  Oral  SpO2: 100% 100%  98%  Weight:      Height:        General: Pt is weeks eye contact, mumbles incoherently.  Appears to be in no acute distress, resting comfortably.  Oral mucosa dry and cracked. Cardiovascular: RRR, nl S1-S2, no murmurs appreciated.   No LE edema.   Respiratory: Normal respiratory rate and rhythm.  CTAB without rales or wheezes. Abdominal: Abdomen soft and non-tender.  No distension or HSM.   Neuro/Psych: Confused, does not follow commands, makes no intelligible utterances.   The  results of significant diagnostics from this hospitalization (including imaging, microbiology, ancillary and laboratory) are listed below for reference.     Microbiology: Recent Results (from the past 240 hour(s))  SARS CORONAVIRUS 2 (TAT 6-24 HRS) Nasopharyngeal Nasopharyngeal Swab     Status: Abnormal   Collection Time: 10/16/19  4:43 PM   Specimen: Nasopharyngeal Swab  Result Value Ref Range Status   SARS Coronavirus 2 POSITIVE (A) NEGATIVE Final    Comment: RESULT CALLED TO, READ BACK BY AND VERIFIED WITH: J.PAGE,RN [id number] 10/17/19 G.MCADOO (NOTE) SARS-CoV-2 target nucleic acids are DETECTED. The SARS-CoV-2 RNA is generally detectable in upper and lower respiratory specimens during the acute phase of  infection. Positive results are indicative of the presence of SARS-CoV-2 RNA. Clinical correlation with patient history and other diagnostic information is  necessary to determine patient infection status. Positive results do not rule out bacterial infection or co-infection with other viruses.  The expected result is Negative. Fact Sheet for Patients: HairSlick.no Fact Sheet for Healthcare Providers: quierodirigir.com This test is not yet approved or cleared by the [place] FDA and  has been authorized for detection and/or diagnosis of SARS-CoV-2 by FDA under an Emergency Use Authorization (EUA). This EUA will remain  in effect (meaning this test can be used) for the du ration of the COVID-19 declaration under Section 564(b)(1) of the Act, 21 U.S.C. section 360bbb-3(b)(1), unless the authorization is terminated or revoked sooner. Performed at [facility], [address] [place], [place] [address]   Blood culture (routine x 2)     Status: None   Collection Time: 10/16/19  5:41 PM   Specimen: BLOOD  Result Value Ref Range Status   Specimen Description BLOOD BLOOD RIGHT HAND  Final   Special Requests   Final    BOTTLES DRAWN AEROBIC AND ANAEROBIC Blood Culture results may not be optimal due to an inadequate volume of blood received in culture bottles   Culture   Final    NO GROWTH 5 DAYS Performed at [facility], [address] [place], [place] [address]    Report Status 10/21/2019 FINAL  Final  MRSA PCR Screening     Status: None   Collection Time: 10/16/19  8:36 PM   Specimen: Nasopharyngeal  Result Value Ref Range Status   MRSA by PCR NEGATIVE NEGATIVE Final    Comment:        The GeneXpert MRSA Assay (FDA approved for NASAL specimens only), is one component of a comprehensive MRSA colonization surveillance program. It is not intended to diagnose MRSA infection nor to guide or monitor treatment  for MRSA infections. Performed at [facility], [address] [place], [place] [address]      Labs: BNP (last 3 results) No results for input(s): BNP in the last 8760 hours. Basic Metabolic Panel: Recent Labs  Lab 10/20/19 0432 10/21/19 0833  NA 145 142  K 3.8 3.8  CL 111 112*  CO2 24 21*  GLUCOSE 99 102*  BUN 29* 23  CREATININE 1.22 1.17  CALCIUM 8.4* 8.4*  MG 2.6* 2.4   Liver Function Tests: No results for input(s): AST, ALT, ALKPHOS, BILITOT, PROT, ALBUMIN in the last 168 hours. No results for input(s): LIPASE, AMYLASE in the last 168 hours. No results for input(s): AMMONIA in the last 168 hours. CBC: No results for input(s): WBC, NEUTROABS, HGB, HCT, MCV, PLT in the last 168 hours. Cardiac Enzymes: No results for input(s): CKTOTAL, CKMB, CKMBINDEX, TROPONINI in the last  168 hours. BNP: Invalid input(s): POCBNP CBG: No results for input(s): GLUCAP in the last 168 hours. D-Dimer No results for input(s): DDIMER in the last 72 hours. Hgb A1c No results for input(s): HGBA1C in the last 72 hours. Lipid Profile No results for input(s): CHOL, HDL, LDLCALC, TRIG, CHOLHDL, LDLDIRECT in the last 72 hours. Thyroid function studies No results for input(s): TSH, T4TOTAL, T3FREE, THYROIDAB in the last 72 hours.  Invalid input(s): FREET3 Anemia work up No results for input(s): VITAMINB12, FOLATE, FERRITIN, TIBC, IRON, RETICCTPCT in the last 72 hours. Urinalysis    Component Value Date/Time   COLORURINE AMBER (A) 10/16/2019 1228   APPEARANCEUR CLOUDY (A) 10/16/2019 1228   LABSPEC 1.021 10/16/2019 1228   PHURINE 5.0 10/16/2019 1228   GLUCOSEU NEGATIVE 10/16/2019 1228   HGBUR NEGATIVE 10/16/2019 1228   BILIRUBINUR NEGATIVE 10/16/2019 1228   KETONESUR NEGATIVE 10/16/2019 1228   PROTEINUR 30 (A) 10/16/2019 1228   NITRITE NEGATIVE 10/16/2019 1228   LEUKOCYTESUR NEGATIVE 10/16/2019 1228   Sepsis Labs Invalid input(s): PROCALCITONIN,  WBC,   LACTICIDVEN Microbiology Recent Results (from the past 240 hour(s))  SARS CORONAVIRUS 2 (TAT 6-24 HRS) Nasopharyngeal Nasopharyngeal Swab     Status: Abnormal   Collection Time: 10/16/19  4:43 PM   Specimen: Nasopharyngeal Swab  Result Value Ref Range Status   SARS Coronavirus 2 POSITIVE (A) NEGATIVE Final    Comment: RESULT CALLED TO, READ BACK BY AND VERIFIED WITH: J.PAGE,RN [id number] 10/17/19 G.MCADOO (NOTE) SARS-CoV-2 target nucleic acids are DETECTED. The SARS-CoV-2 RNA is generally detectable in upper and lower respiratory specimens during the acute phase of infection. Positive results are indicative of the presence of SARS-CoV-2 RNA. Clinical correlation with patient history and other diagnostic information is  necessary to determine patient infection status. Positive results do not rule out bacterial infection or co-infection with other viruses.  The expected result is Negative. Fact Sheet for Patients: HairSlick.nohttps://www.fda.gov/media/138098/download Fact Sheet for Healthcare Providers: quierodirigir.comhttps://www.fda.gov/media/138095/download This test is not yet approved or cleared by the [place] FDA and  has been authorized for detection and/or diagnosis of SARS-CoV-2 by FDA under an Emergency Use Authorization (EUA). This EUA will remain  in effect (meaning this test can be used) for the du ration of the COVID-19 declaration under Section 564(b)(1) of the Act, 21 U.S.C. section 360bbb-3(b)(1), unless the authorization is terminated or revoked sooner. Performed at [facility], [address] [place], [place] [address]   Blood culture (routine x 2)     Status: None   Collection Time: 10/16/19  5:41 PM   Specimen: BLOOD  Result Value Ref Range Status   Specimen Description BLOOD BLOOD RIGHT HAND  Final   Special Requests   Final    BOTTLES DRAWN AEROBIC AND ANAEROBIC Blood Culture results may not be optimal due to an inadequate volume of blood received in culture bottles   Culture    Final    NO GROWTH 5 DAYS Performed at [facility], [address] [place], [place] [address]    Report Status 10/21/2019 FINAL  Final  MRSA PCR Screening     Status: None   Collection Time: 10/16/19  8:36 PM   Specimen: Nasopharyngeal  Result Value Ref Range Status   MRSA by PCR NEGATIVE NEGATIVE Final    Comment:        The GeneXpert MRSA Assay (FDA approved for NASAL specimens only), is one component of a comprehensive MRSA colonization surveillance program. It is not intended to diagnose  MRSA infection nor to guide or monitor treatment for MRSA infections. Performed at [facility], [address] [place], [place] [address]      Time coordinating discharge: 25 minutes The [place] controlled substances registry was reviewed, however this is a hospice patient.       SIGNED:   Alberteen Sam, MD  [facility] 10/26/2019, 12:30 PM

## 2019-10-26 NOTE — [place] FL2 (Signed)
[place] MEDICAID FL2 LEVEL OF CARE SCREENING TOOL     IDENTIFICATION  Patient Name: Marc Perez Birthdate: May 01, 1930 Sex: male Admission Date (Current Location): 10/16/2019  [place] and [place] Number:  Chiropodist and Address:  [facility], [address], [place], [place] [address]      Provider Number: [id number]  Attending Physician Name and Address:  Alberteen Sam, *  Relative Name and Phone Number:  ridenhour,jerry Other   [phone]    Current Level of Care: Hospital Recommended Level of Care: Assisted Living Facility Prior Approval Number:    Date Approved/Denied:   PASRR Number:    Discharge Plan: Domiciliary (Rest home)([name])    Current Diagnoses: Patient Active Problem List   Diagnosis Date Noted  . Palliative care by specialist   . Altered mental status   . DNR (do not resuscitate)   . Adult failure to thrive   . Dementia with behavioral disturbance (HCC) 10/18/2019  . Hypernatremia 10/16/2019  . Acute renal failure (ARF) (HCC) 10/16/2019  . Lactic acidosis 10/16/2019    Orientation RESPIRATION BLADDER Height & Weight     Self, Time  Normal Incontinent Weight: (pt on low bed) Height:  5\' 10"  (177.8 cm)  BEHAVIORAL SYMPTOMS/MOOD NEUROLOGICAL BOWEL NUTRITION STATUS      Continent Diet(Comfort feeds, liberal diet)  AMBULATORY STATUS COMMUNICATION OF NEEDS Skin   Independent Verbally Normal                       Personal Care Assistance Level of Assistance  Bathing, Dressing, Feeding Bathing Assistance: Limited assistance Feeding assistance: Independent Dressing Assistance: Limited assistance     Functional Limitations Info  Sight, Hearing, Speech Sight Info: Adequate Hearing Info: Adequate Speech Info: Adequate    SPECIAL CARE FACTORS FREQUENCY                       Contractures Contractures Info: Not present    Additional Factors Info  Code Status, Allergies  Code Status Info: DNR Allergies Info: NKA           Current Medications (10/26/2019):  This is the current hospital active medication list Current Facility-Administered Medications  Medication Dose Route Frequency Provider Last Rate Last Admin  . acetaminophen (TYLENOL) tablet 650 mg  650 mg Oral Q6H PRN 12/24/2019, MD       Or  . acetaminophen (TYLENOL) suppository 650 mg  650 mg Rectal Q6H PRN Lurene Shadow, MD      . LORazepam (ATIVAN) tablet 1 mg  1 mg Oral Q6H PRN Lurene Shadow, NP      . MEDLINE mouth rinse  15 mL Mouth Rinse BID Canary Brim, MD   15 mL at 10/26/19 1148  . morphine CONCENTRATE 10 MG/0.5ML oral solution 5 mg  5 mg Oral Q2H PRN 12/24/19, NP      . ondansetron [facility]) tablet 4 mg  4 mg Oral Q6H PRN [facility], MD       Or  . ondansetron (ZOFRAN) injection 4 mg  4 mg Intravenous Q6H PRN Lurene Shadow, MD      . senna-docusate (Senokot-S) tablet 1 tablet  1 tablet Oral QHS PRN Lurene Shadow, MD         Discharge Medications: STOP taking these medications   amLODipine 5 MG tablet Commonly known as: NORVASC   aspirin 81 MG EC tablet   busPIRone 5 MG tablet Commonly  known as: BUSPAR   Calcium 600-D 600-400 MG-UNIT Tabs Generic drug: Calcium Carbonate-Vitamin D3   donepezil 10 MG tablet Commonly known as: ARICEPT   Fish Oil 1000 MG Caps   Ginkgo Biloba Memory Enhancer 60 MG Caps Generic drug: Ginkgo Biloba Extract   Lutein 20 MG Caps   Red Yeast Rice Extract 600 MG Caps   vitamin E 400 UNIT capsule     TAKE these medications   acetaminophen 325 MG tablet Commonly known as: TYLENOL Take 2 tablets (650 mg total) by mouth every 6 (six) hours as needed for mild pain (or Fever >/= 101). What changed:   when to take this  reasons to take this   LORazepam 1 MG tablet Commonly known as: ATIVAN Take 1 tablet (1 mg total) by mouth every 6 (six) hours as needed for anxiety.   morphine CONCENTRATE 10 MG/0.5ML Soln  concentrated solution Take 0.25 mLs (5 mg total) by mouth every 2 (two) hours as needed for moderate pain, severe pain or shortness of breath.   mouth rinse Liqd solution 15 mLs by Mouth Rinse route 2 (two) times daily.   ondansetron 4 MG tablet Commonly known as: ZOFRAN Take 1 tablet (4 mg total) by mouth every 6 (six) hours as needed for nausea.   senna-docusate 8.6-50 MG tablet Commonly known as: Senokot-S Take 1 tablet by mouth at bedtime as needed for mild constipation.     Relevant Imaging Results:  Relevant Lab Results:   Additional Information SSN [id number]  [name], LCSW

## 2019-11-25 DEATH — deceased

## 2020-05-08 IMAGING — CR DG CHEST 1V
1 series · 1 of 1 positions shown · non-contrast
Comparison: None.

CLINICAL DATA: Weakness

EXAM:
CHEST  1 VIEW

[x chest ap]
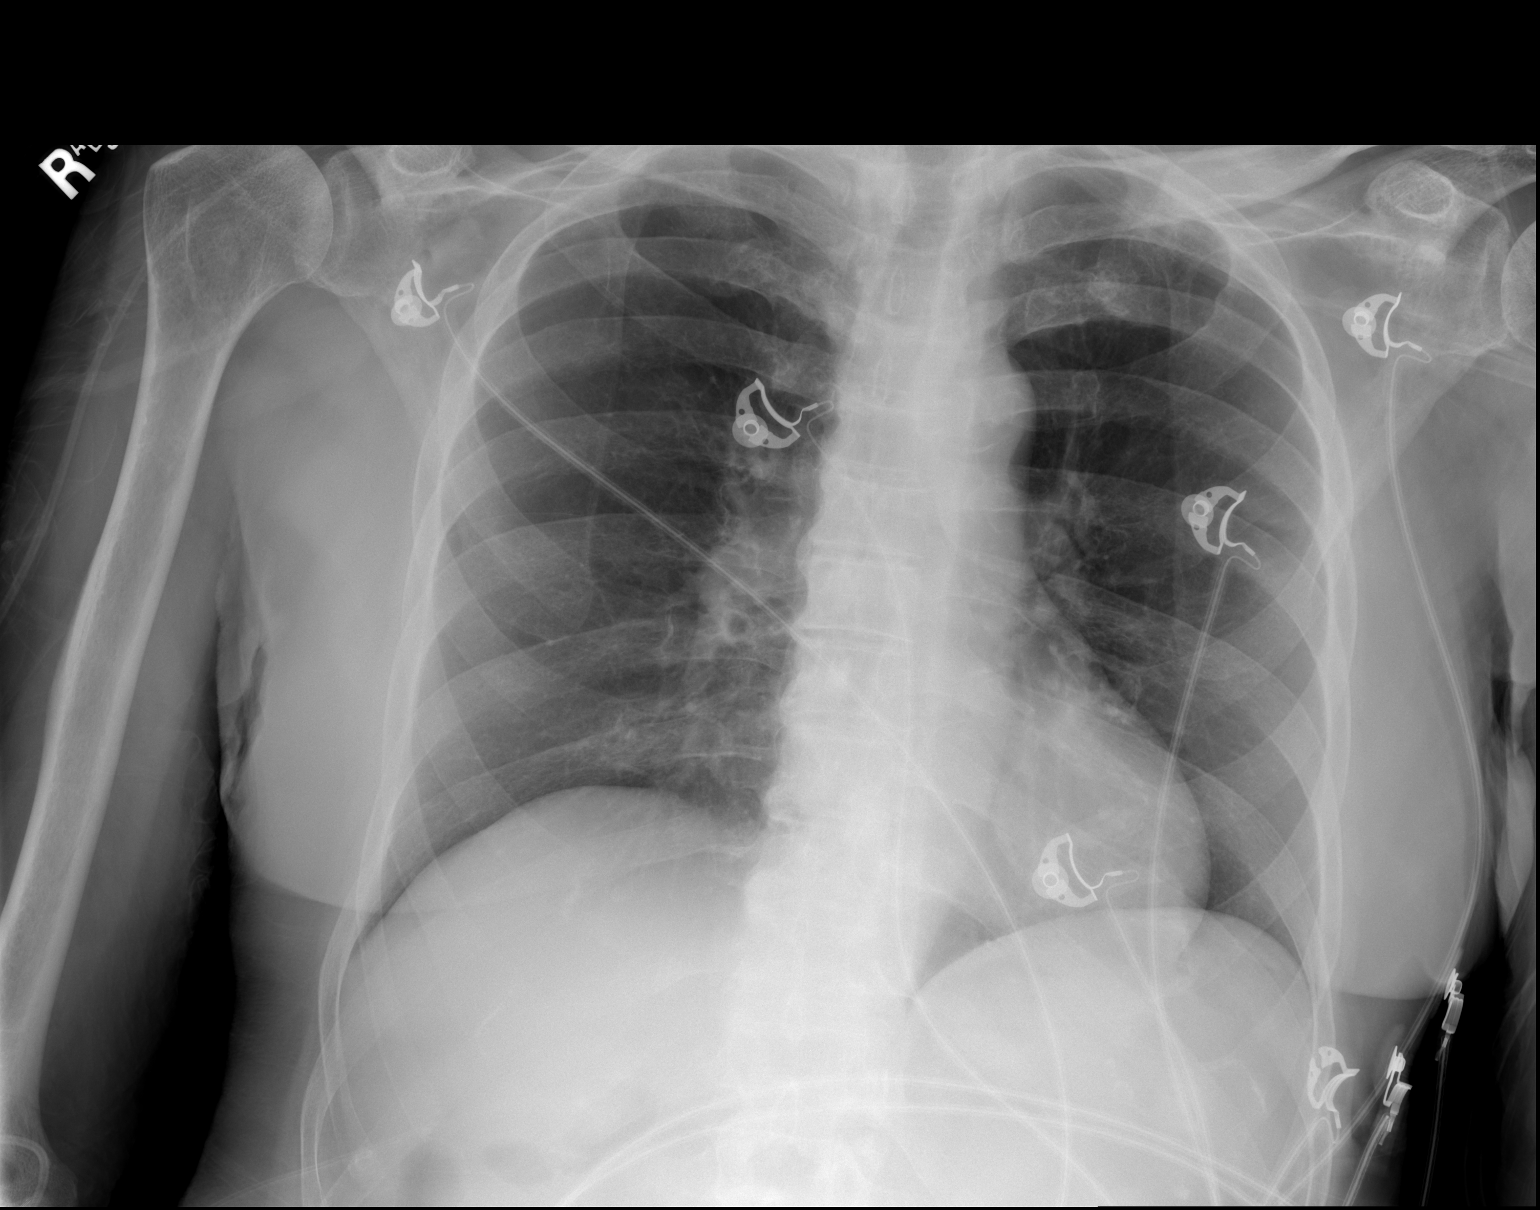

[1 of 1 positions shown; findings below may reference images not displayed]

FINDINGS: The heart size and mediastinal contours are within normal limits.
Minimal patchy density at the right lung base. The visualized
skeletal structures are unremarkable.
IMPRESSION: Minimal patchy atelectasis/consolidation at the right lung base.

## 2020-05-08 IMAGING — CT CT HEAD W/O CM
3 of 4 series · 14 of 47 positions shown, 16 images · non-contrast
Comparison: None.

CLINICAL DATA: Progressive altered mental status. History of
dementia. [id number].

EXAM:
CT HEAD WITHOUT CONTRAST
TECHNIQUE: Contiguous axial images were obtained from the base of the skull
through the vertex without intravenous contrast.

[Series 2: ax head wo · axial · 0.37mm/px · z∈[-209,-43]mm · 8 of 40 slices shown, 10 images]
[im 3/40  brain]
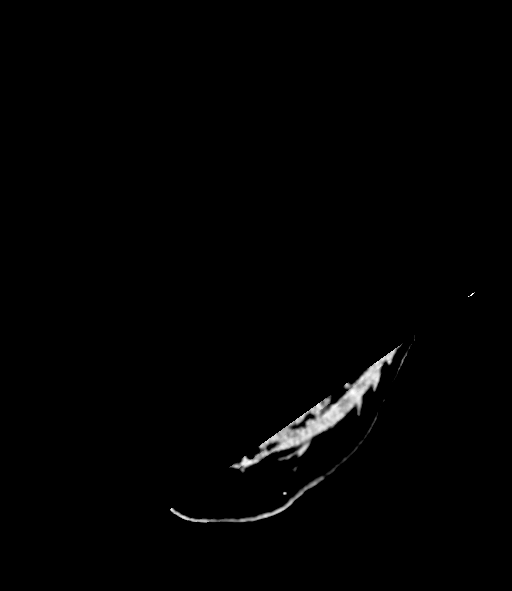
[im 3/40  bone]
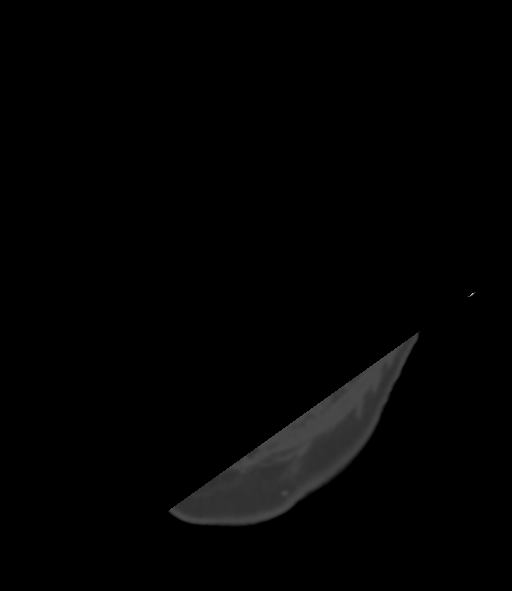
[im 8/40  brain]
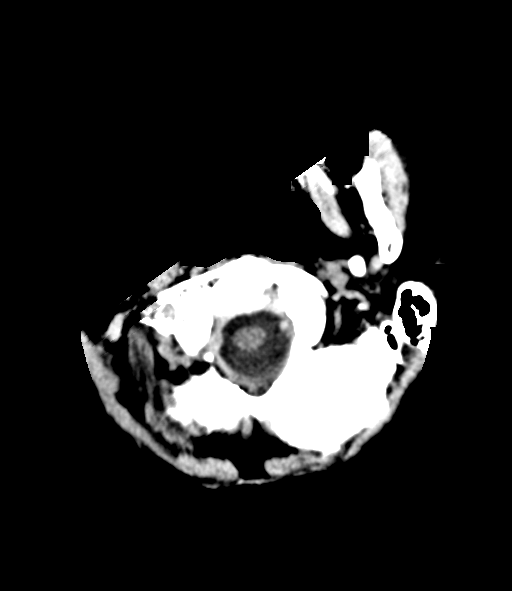
[im 14/40  brain]
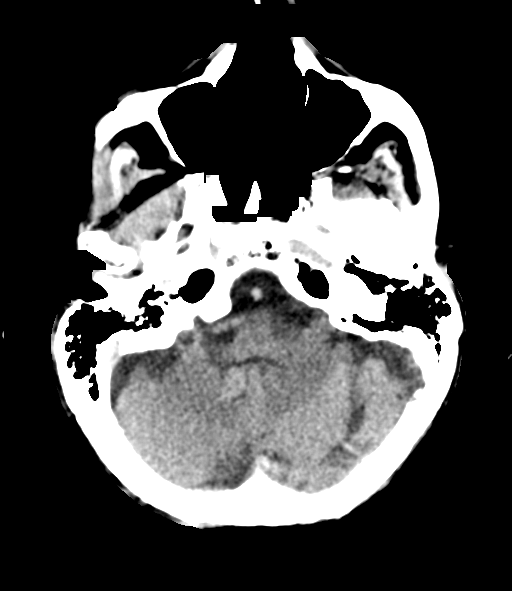
[im 19/40  brain]
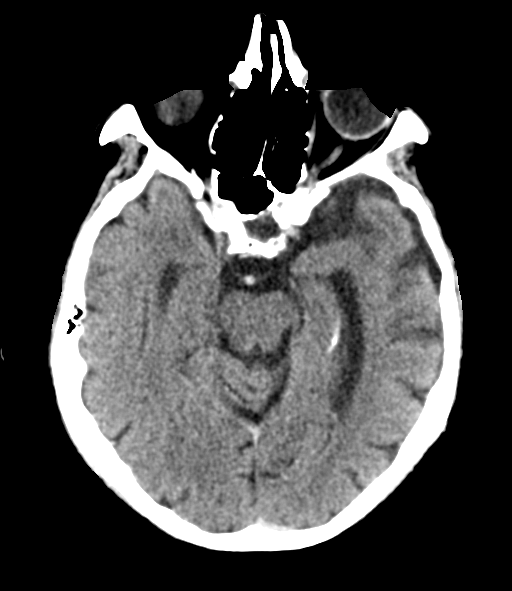
[im 21/40  brain]
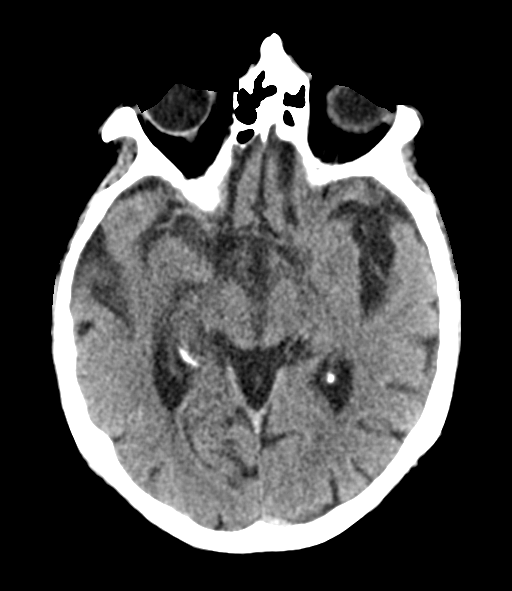
[im 21/40  bone]
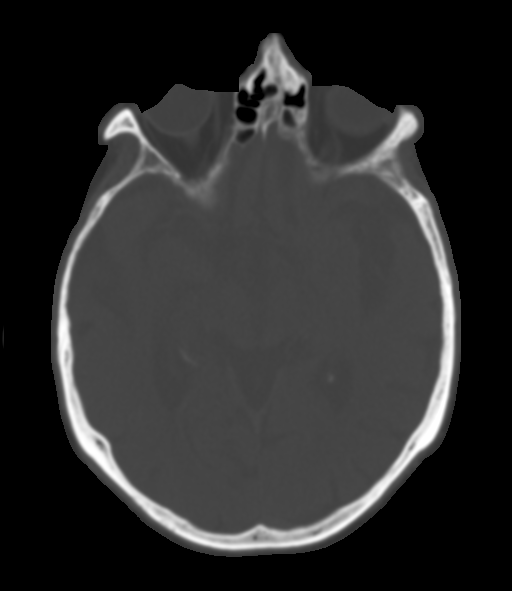
[im 27/40  brain]
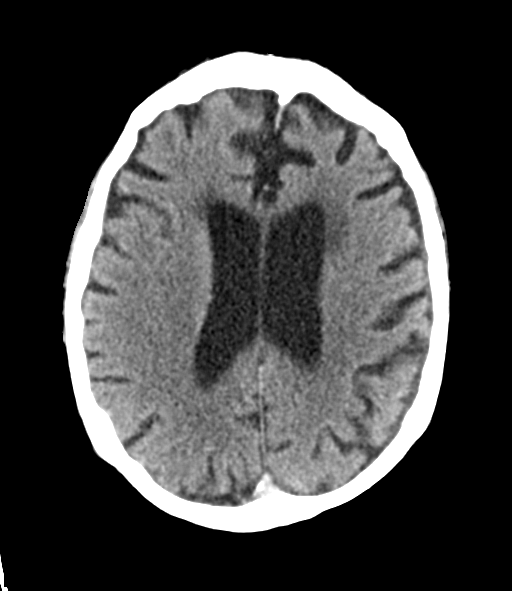
[im 32/40  brain]
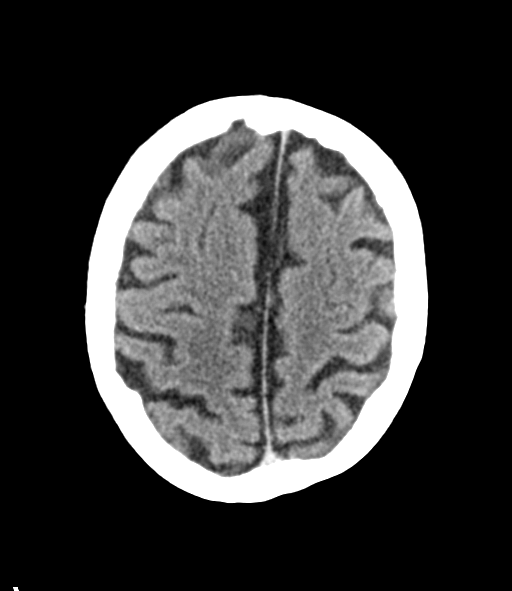
[im 37/40  brain]
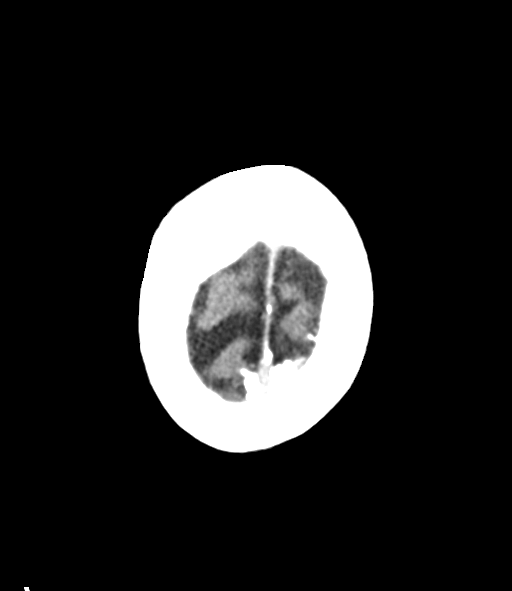

[Series 4: coronal soft tissue · coronal · 0.37mm/px · 3 of 67 slices shown]
[im 23/67  brain]
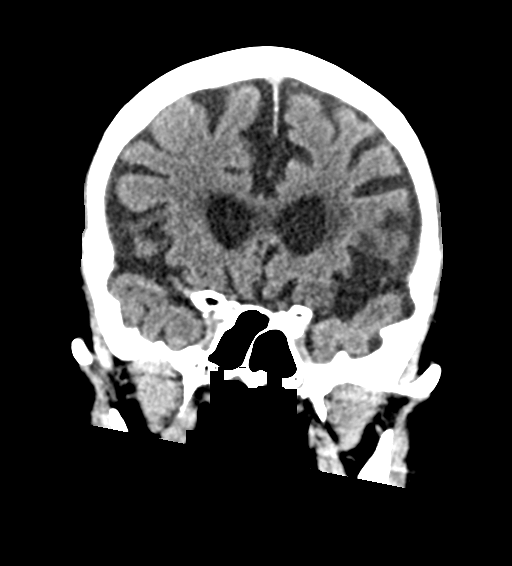
[im 30/67  brain]
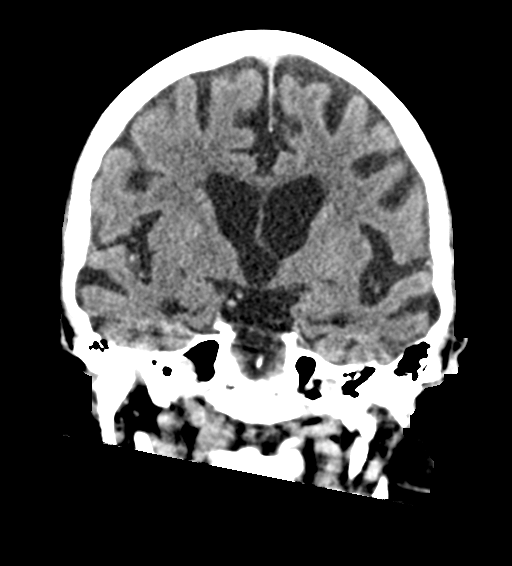
[im 37/67  brain]
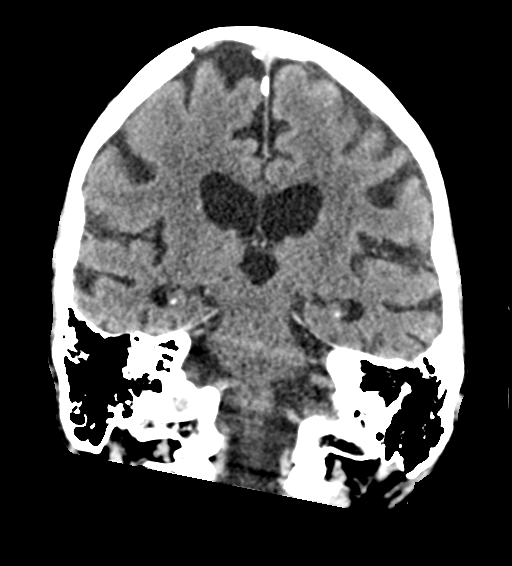

[Series 5: sagittal soft tissue · sagittal · 0.41mm/px · 3 of 56 slices shown]
[im 23/56  brain]
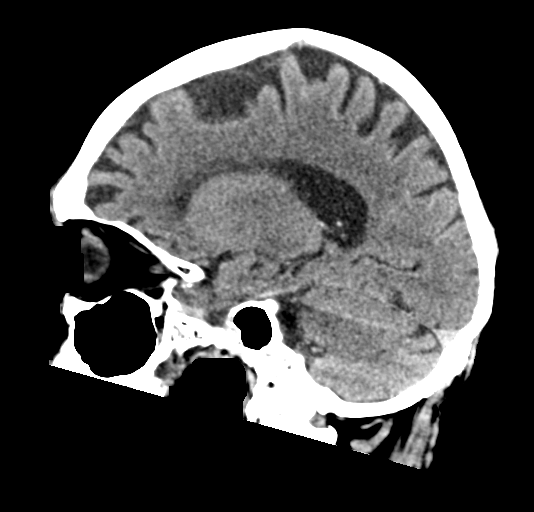
[im 28/56  brain]
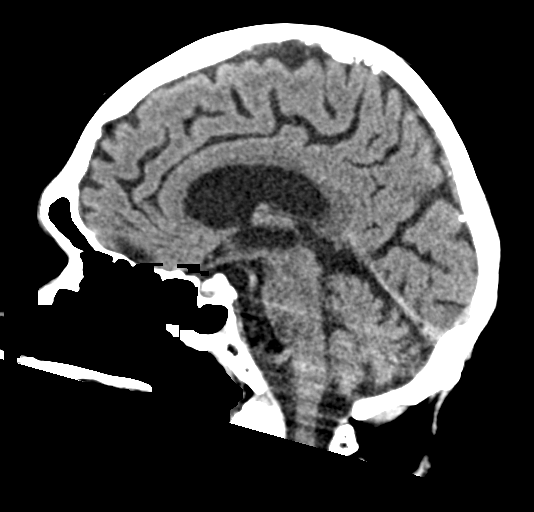
[im 34/56  brain]
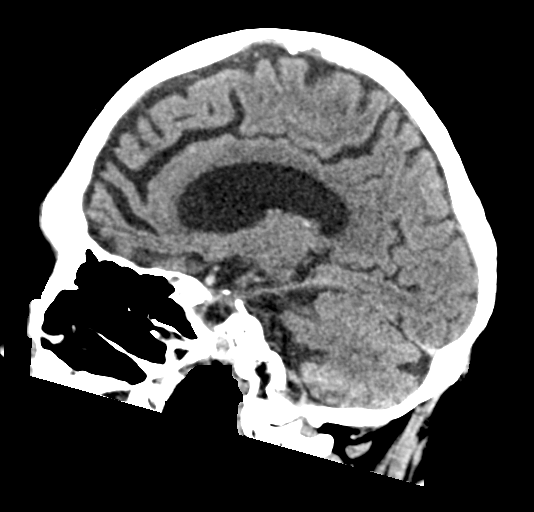

[14 of 47 positions shown; findings below may reference images not displayed]

FINDINGS: Brain: No evidence of acute infarction, hemorrhage, hydrocephalus,
extra-axial collection or mass lesion/mass effect. Diffuse moderate
atrophy with secondary ventricular dilatation. Chronic
periventricular white matter lucency particularly in the frontal
lobes, likely small vessel ischemic disease.

Vascular: No hyperdense vessel or unexpected calcification.

Skull: Normal. Negative for fracture or focal lesion.

Sinuses/Orbits: Normal.

Other: None
IMPRESSION: No acute abnormality. Atrophy with chronic small vessel ischemic
disease.

## 2020-05-09 IMAGING — DX DG CHEST 1V PORT
2 series · 2 of 2 positions shown · non-contrast
Comparison: 10/16/2019

CLINICAL DATA: COVID positive.  Pneumonia.

EXAM:
PORTABLE CHEST 1 VIEW

[chest ap (1 of 2)]
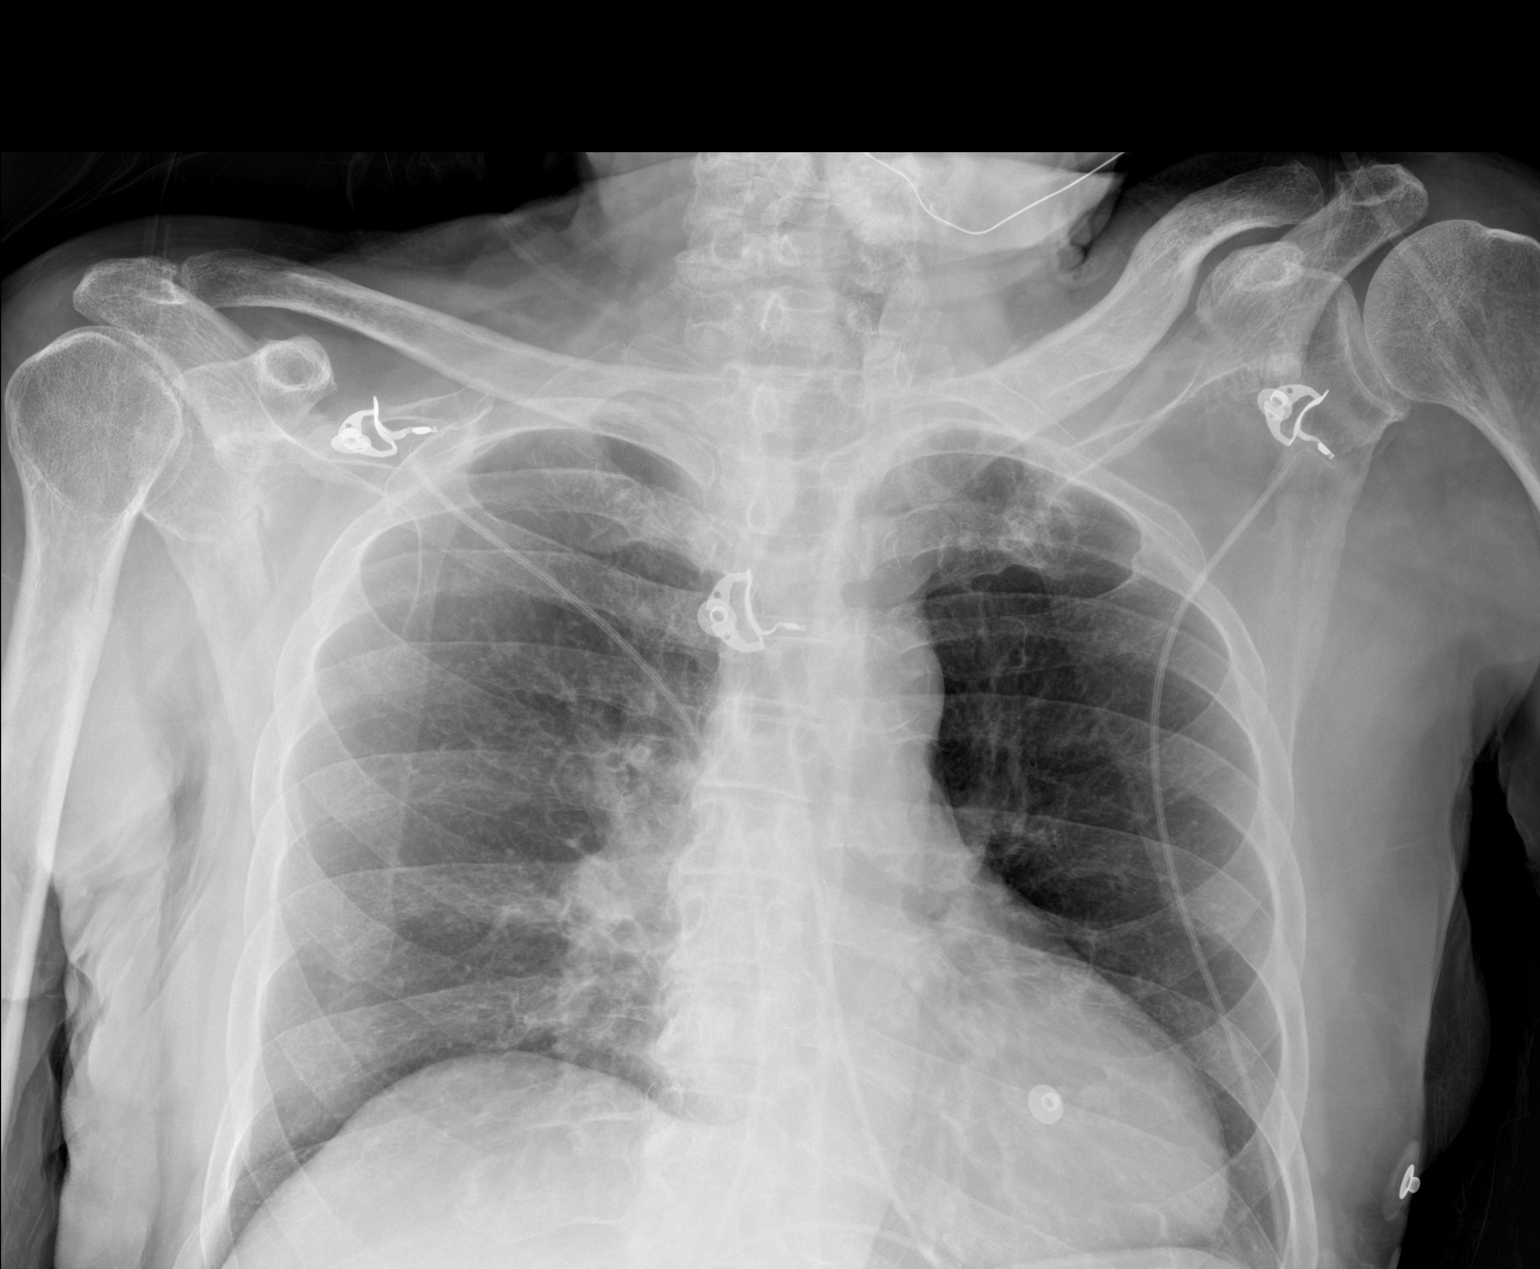

[chest ap (2 of 2)]
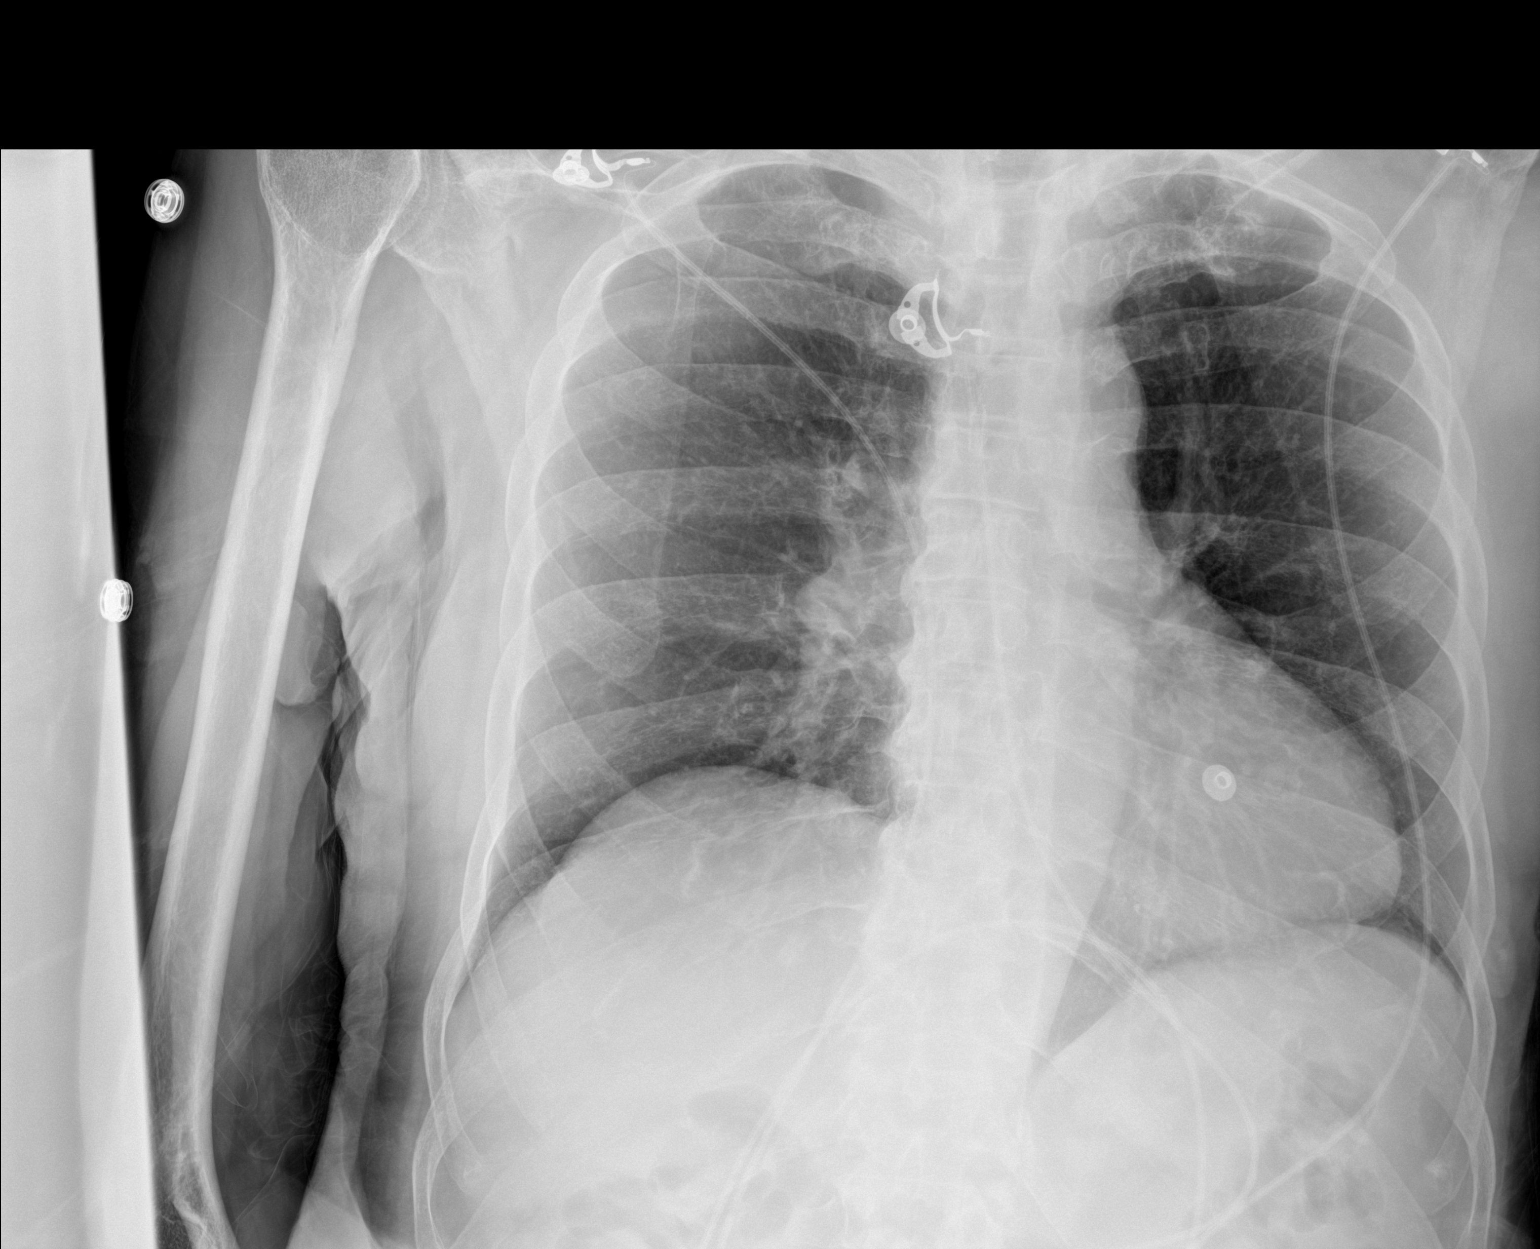

[2 of 2 positions shown; findings below may reference images not displayed]

FINDINGS: Heart and mediastinal contours are within normal limits. No focal
opacities or effusions. No acute bony abnormality.
IMPRESSION: No active disease.
